# Patient Record
Sex: Female | Born: 1965 | Hispanic: Yes | Marital: Married | Smoking: Never smoker
Health system: Southern US, Community
[De-identification: ages and names within clinical notes are randomized; demographics above are authoritative.]

## PROBLEM LIST (undated history)

## (undated) DIAGNOSIS — E785 Hyperlipidemia, unspecified: Secondary | ICD-10-CM

## (undated) DIAGNOSIS — N200 Calculus of kidney: Secondary | ICD-10-CM

## (undated) HISTORY — DX: Hyperlipidemia, unspecified: E78.5

## (undated) HISTORY — DX: Calculus of kidney: N20.0

## (undated) HISTORY — PX: TOTAL ABDOMINAL HYSTERECTOMY W/ BILATERAL SALPINGOOPHORECTOMY: SHX83

## (undated) HISTORY — PX: CHOLECYSTECTOMY: SHX55

## (undated) HISTORY — PX: COLONOSCOPY: SHX174

---

## 2004-01-01 ENCOUNTER — Emergency Department (HOSPITAL_COMMUNITY): Admission: EM | Admit: 2004-01-01 | Discharge: 2004-01-01 | Payer: Self-pay | Admitting: Family Medicine

## 2005-05-03 ENCOUNTER — Encounter (INDEPENDENT_AMBULATORY_CARE_PROVIDER_SITE_OTHER): Payer: Self-pay | Admitting: Specialist

## 2005-05-03 ENCOUNTER — Observation Stay (HOSPITAL_COMMUNITY): Admission: RE | Admit: 2005-05-03 | Discharge: 2005-05-04 | Payer: Self-pay | Admitting: *Deleted

## 2005-10-14 ENCOUNTER — Emergency Department (HOSPITAL_COMMUNITY): Admission: EM | Admit: 2005-10-14 | Discharge: 2005-10-15 | Payer: Self-pay | Admitting: Emergency Medicine

## 2006-06-09 ENCOUNTER — Encounter: Admission: RE | Admit: 2006-06-09 | Discharge: 2006-06-09 | Payer: Self-pay | Admitting: *Deleted

## 2007-03-30 LAB — HM PAP SMEAR: HM Pap smear: 0

## 2008-02-29 ENCOUNTER — Encounter: Payer: Self-pay | Admitting: Gynecology

## 2008-02-29 ENCOUNTER — Other Ambulatory Visit: Admission: RE | Admit: 2008-02-29 | Discharge: 2008-02-29 | Payer: Self-pay | Admitting: Gynecology

## 2008-02-29 ENCOUNTER — Ambulatory Visit: Payer: Self-pay | Admitting: Gynecology

## 2009-07-27 LAB — HM MAMMOGRAPHY: HM Mammogram: 0

## 2009-08-21 ENCOUNTER — Encounter: Admission: RE | Admit: 2009-08-21 | Discharge: 2009-08-21 | Payer: Self-pay | Admitting: Family Medicine

## 2009-12-23 ENCOUNTER — Ambulatory Visit: Payer: Self-pay | Admitting: Gynecology

## 2009-12-23 ENCOUNTER — Encounter: Payer: Self-pay | Admitting: Emergency Medicine

## 2009-12-23 ENCOUNTER — Ambulatory Visit (HOSPITAL_COMMUNITY): Admission: AD | Admit: 2009-12-23 | Discharge: 2009-12-23 | Payer: Self-pay | Admitting: Gynecology

## 2009-12-29 ENCOUNTER — Ambulatory Visit: Payer: Self-pay | Admitting: Gynecology

## 2010-03-29 LAB — HM COLONOSCOPY: HM Colonoscopy: 0

## 2010-04-19 ENCOUNTER — Encounter: Payer: Self-pay | Admitting: *Deleted

## 2010-06-11 LAB — CBC
Hemoglobin: 14.8 g/dL (ref 12.0–15.0)
MCH: 33.3 pg (ref 26.0–34.0)
RBC: 4.44 MIL/uL (ref 3.87–5.11)
RDW: 12.7 % (ref 11.5–15.5)
WBC: 7.5 10*3/uL (ref 4.0–10.5)

## 2010-06-11 LAB — URINALYSIS, ROUTINE W REFLEX MICROSCOPIC
Glucose, UA: NEGATIVE mg/dL
Ketones, ur: 15 mg/dL — AB
Nitrite: NEGATIVE
Protein, ur: NEGATIVE mg/dL
Urobilinogen, UA: 0.2 mg/dL (ref 0.0–1.0)
pH: 6 (ref 5.0–8.0)

## 2010-06-11 LAB — LIPASE, BLOOD: Lipase: 28 U/L (ref 11–59)

## 2010-06-11 LAB — POCT PREGNANCY, URINE: Preg Test, Ur: NEGATIVE

## 2010-06-11 LAB — DIFFERENTIAL
Basophils Absolute: 0 10*3/uL (ref 0.0–0.1)
Eosinophils Absolute: 0 10*3/uL (ref 0.0–0.7)
Lymphs Abs: 0.8 10*3/uL (ref 0.7–4.0)
Monocytes Relative: 2 % — ABNORMAL LOW (ref 3–12)
Neutro Abs: 6.5 10*3/uL (ref 1.7–7.7)

## 2010-06-11 LAB — COMPREHENSIVE METABOLIC PANEL
AST: 39 U/L — ABNORMAL HIGH (ref 0–37)
Alkaline Phosphatase: 77 U/L (ref 39–117)
BUN: 8 mg/dL (ref 6–23)
Calcium: 9.3 mg/dL (ref 8.4–10.5)
Glucose, Bld: 136 mg/dL — ABNORMAL HIGH (ref 70–99)
Total Bilirubin: 0.9 mg/dL (ref 0.3–1.2)

## 2010-08-03 ENCOUNTER — Other Ambulatory Visit: Payer: Self-pay | Admitting: Family Medicine

## 2010-08-03 DIAGNOSIS — Z1231 Encounter for screening mammogram for malignant neoplasm of breast: Secondary | ICD-10-CM

## 2010-08-14 NOTE — Op Note (Signed)
Tara Bruce, LASTER NO.:  [id number]   MEDICAL RECORD NO.:  [id number]          PATIENT TYPE:  OBV   LOCATION:  9399                          FACILITY:  [facility]   PHYSICIAN:  Caspar B. Earlene Plater, M.D.  DATE OF BIRTH:  03-22-1966   DATE OF PROCEDURE:  05/03/2005  DATE OF DISCHARGE:                                 OPERATIVE REPORT   PREOPERATIVE DIAGNOSES:  1.  Menorrhagia.  2.  Uterine fibroids.   POSTOPERATIVE DIAGNOSES:  1.  Menorrhagia.  2.  Uterine fibroids.   OPERATION/PROCEDURE:  Laparoscopic-assisted vaginal hysterectomy.   SURGEON:  Chester Holstein. Earlene Plater, M.D.   ASSISTANT:  Genia Del, M.D.   ANESTHESIA:  General.   SPECIMENS:  195-gram uterus and cervix.  Normal tubes and ovaries.   ESTIMATED BLOOD LOSS:  100 mg.   COMPLICATIONS:  None.   INDICATIONS:  The patient with a history of heavy and irregular menstrual  bleeding, not responding to medical management.  Previous hysteroscopy  dilatation and curettage showed only a hyperplastic polyp and benign  endometrium. No features of atypia.  The patient desires definitive surgical  management.  Advised of the risks of surgery including infection, bleeding,  damage to surrounding organs.   DESCRIPTION OF PROCEDURE:  The patient was taken to the operating room and  general anesthesia obtained.  The patient was placed in the [facility] stirrups  and prepped and draped in the standard fashion and Foley catheter inserted  into the bladder.  Indigo carmine soaked sponge stick was placed in the  vagina in the anterior portio.   A 10 mm incision was placed in the umbilicus, carried sharply to the fascia.  The fascia was divided sharply and elevated.  The posterior sheath and  peritoneum elevated and entered sharply.  Pursestring suture of 0 Vicryl  placed in the around the fascial defect.  Hasson cannula inserted and  secured.  Pneumoperitoneum obtained with CO2 gas.   Trendelenburg position was obtained.   Pelvis and abdomen inspected with the  above findings noted.  In addition, the appendix and upper abdomen appeared  normal.   A 5 mm Xcel trocar was placed in each lower quadrant under direct  laparoscopic visualization.   The course of each ureter was identified.  Left cornua grasped with the  tooth tenaculum.  Round ligament, tube and utero-ovarian ligament were  sequentially sealed and divided with the Harmonic scalpel.  Broad ligament  was skeletonized with the Harmonic.  Bladder flap created sharply with the  Harmonic.  Left uterine artery sealed and divided with the Harmonic.  Entire  procedure was repeated on the right side in the same manner.  Bladder flap  was created sharply with the Harmonic.  Bladder mobilized inferiorly.  Anterior colpotomy made with the Harmonic, repeated posteriorly in similar  manner.  Pneumoperitoneum was released.  Pedicles were hemostatic.   Attention turned to the vagina.  Weighted speculum was inserted and the  colpotomies extended sharply and with Bovie cautery.  The uterosacral  ligaments were clamped with curved Heaneys, divided and suture ligated with  0 Vicryl.  The remainder of the cardinal ligaments were then clamped with  the LigaSure and cauterized and divided sharply.  Uterus and cervix were  removed as an intact specimen.  One bleeder noted on the left vaginal cuff  in the midline which was made hemostatic with a figure-of-eight stitch of 0  Vicryl.  A culdoplasty suture placed posteriorly from posterior vagina to  the left uterosacral ligament reefing along the posterior peritoneum, taking  a bite of the right uterosacral ligament and exiting posteriorly.  The  remainder of the vaginal cuff was closed with interrupted figure-of-eight  sutures of 0 Vicryl and hemostasis was obtained.  Culdoplasty suture was  then tied down with good support of the cuff and reapproximation of the  uterosacral ligaments.   Attention turned to the abdomen.   Pneumoperitoneum was reobtained.  Pelvis  irrigated, inspected and was hemostatic.  Therefore, the procedure was  terminated.  Inferior ports removed.  Their sites were hemostatic.  Scope  removed.  Gas released. Hasson cannula removed.  Abdominal wall elevated  with [employer] retractors and the pursestring sutures snugged down.  This  obliterated the fascial defect and no intra-abdominal contents herniated  through the prior closure.   Skin was closed in the umbilicus with 4-0 Vicryl.  Skin was closed in the  inferior ports with Dermabond.  The patient tolerated the procedure well.  There were no complications.  She was taken to the recovery room awake,  alert and in stable condition.  All counts were correct per the operating  room staff.      Gerri Spore B. Earlene Plater, M.D.  Electronically Signed     WBD/MEDQ  D:  05/03/2005  T:  05/03/2005  Job:  [id number]

## 2010-08-27 ENCOUNTER — Ambulatory Visit: Payer: Self-pay

## 2010-08-28 ENCOUNTER — Ambulatory Visit
Admission: RE | Admit: 2010-08-28 | Discharge: 2010-08-28 | Disposition: A | Discharge status: Home / Self Care | Payer: Medicaid Other | Source: Ambulatory Visit | Attending: Family Medicine | Admitting: Family Medicine

## 2010-08-28 DIAGNOSIS — Z1231 Encounter for screening mammogram for malignant neoplasm of breast: Secondary | ICD-10-CM

## 2010-09-28 ENCOUNTER — Encounter: Payer: Self-pay | Admitting: Family Medicine

## 2011-03-30 HISTORY — PX: LAPAROSCOPIC OOPHERECTOMY: SHX6507

## 2011-08-13 ENCOUNTER — Other Ambulatory Visit: Payer: Self-pay | Admitting: Family Medicine

## 2011-08-13 DIAGNOSIS — Z1231 Encounter for screening mammogram for malignant neoplasm of breast: Secondary | ICD-10-CM

## 2011-09-02 ENCOUNTER — Ambulatory Visit: Payer: Medicaid Other

## 2012-03-16 ENCOUNTER — Emergency Department (HOSPITAL_COMMUNITY): Payer: BC Managed Care – PPO

## 2012-03-16 ENCOUNTER — Encounter (HOSPITAL_COMMUNITY): Payer: Self-pay

## 2012-03-16 ENCOUNTER — Other Ambulatory Visit: Payer: Self-pay

## 2012-03-16 ENCOUNTER — Emergency Department (HOSPITAL_COMMUNITY)
Admission: EM | Admit: 2012-03-16 | Discharge: 2012-03-16 | Disposition: A | Discharge status: Home / Self Care | Payer: BC Managed Care – PPO | Attending: Emergency Medicine | Admitting: Emergency Medicine

## 2012-03-16 DIAGNOSIS — N201 Calculus of ureter: Secondary | ICD-10-CM

## 2012-03-16 DIAGNOSIS — Z8742 Personal history of other diseases of the female genital tract: Secondary | ICD-10-CM | POA: Insufficient documentation

## 2012-03-16 DIAGNOSIS — R1110 Vomiting, unspecified: Secondary | ICD-10-CM | POA: Insufficient documentation

## 2012-03-16 DIAGNOSIS — Z90710 Acquired absence of both cervix and uterus: Secondary | ICD-10-CM | POA: Insufficient documentation

## 2012-03-16 LAB — COMPREHENSIVE METABOLIC PANEL
ALT: 32 U/L (ref 0–35)
AST: 25 U/L (ref 0–37)
Albumin: 4.1 g/dL (ref 3.5–5.2)
Alkaline Phosphatase: 102 U/L (ref 39–117)
BUN: 12 mg/dL (ref 6–23)
CO2: 22 mEq/L (ref 19–32)
Calcium: 9.4 mg/dL (ref 8.4–10.5)
Chloride: 99 mEq/L (ref 96–112)
Creatinine, Ser: 0.75 mg/dL (ref 0.50–1.10)
GFR calc Af Amer: >90 mL/min (ref >90)
GFR calc non Af Amer: >90 mL/min (ref >90)
Glucose, Bld: 121 mg/dL — ABNORMAL HIGH (ref 70–99)
Potassium: 3.3 mEq/L — ABNORMAL LOW (ref 3.5–5.1)
Sodium: 136 mEq/L (ref 135–145)
Total Bilirubin: 0.5 mg/dL (ref 0.3–1.2)
Total Protein: 7.5 g/dL (ref 6.0–8.3)

## 2012-03-16 LAB — CBC WITH DIFFERENTIAL/PLATELET
Basophils Absolute: 0 10*3/uL (ref 0.0–0.1)
Basophils Relative: 0 % (ref 0–1)
Eosinophils Absolute: 0 10*3/uL (ref 0.0–0.7)
Eosinophils Relative: 0 % (ref 0–5)
HCT: 40.3 % (ref 36.0–46.0)
Hemoglobin: 14.9 g/dL (ref 12.0–15.0)
Lymphocytes Relative: 10 % — ABNORMAL LOW (ref 12–46)
Lymphs Abs: 0.8 10*3/uL (ref 0.7–4.0)
MCH: 33.1 pg (ref 26.0–34.0)
MCHC: 37 g/dL — ABNORMAL HIGH (ref 30.0–36.0)
MCV: 89.6 fL (ref 78.0–100.0)
Monocytes Absolute: 0.5 10*3/uL (ref 0.1–1.0)
Monocytes Relative: 6 % (ref 3–12)
Neutro Abs: 6.9 10*3/uL (ref 1.7–7.7)
Neutrophils Relative %: 84 % — ABNORMAL HIGH (ref 43–77)
Platelets: 300 10*3/uL (ref 150–400)
RBC: 4.5 MIL/uL (ref 3.87–5.11)
RDW: 12.8 % (ref 11.5–15.5)
WBC: 8.3 10*3/uL (ref 4.0–10.5)

## 2012-03-16 LAB — URINE MICROSCOPIC-ADD ON

## 2012-03-16 LAB — URINALYSIS, ROUTINE W REFLEX MICROSCOPIC
Bilirubin Urine: NEGATIVE
Glucose, UA: NEGATIVE mg/dL
Ketones, ur: 15 mg/dL — AB
Leukocytes, UA: NEGATIVE
Nitrite: NEGATIVE
Protein, ur: NEGATIVE mg/dL
Specific Gravity, Urine: 1.025 (ref 1.005–1.030)
Urobilinogen, UA: 0.2 mg/dL (ref 0.0–1.0)
pH: 6 (ref 5.0–8.0)

## 2012-03-16 LAB — LIPASE, BLOOD: Lipase: 29 U/L (ref 11–59)

## 2012-03-16 MED ORDER — OXYCODONE-ACETAMINOPHEN 5-325 MG PO TABS: 1.0000 | ORAL_TABLET | Freq: Four times a day (QID) | ORAL | Status: DC | PRN

## 2012-03-16 MED ORDER — SODIUM CHLORIDE 0.9 % IV BOLUS (SEPSIS)
1000.0000 mL | Freq: Once | INTRAVENOUS | Status: AC
  Administered 2012-03-16: 1000 mL via INTRAVENOUS

## 2012-03-16 MED ORDER — PROMETHAZINE HCL 25 MG PO TABS: 25.0000 mg | ORAL_TABLET | Freq: Four times a day (QID) | ORAL | Status: DC | PRN

## 2012-03-16 MED ORDER — KETOROLAC TROMETHAMINE 30 MG/ML IJ SOLN
30.0000 mg | Freq: Once | INTRAMUSCULAR | Status: AC
  Administered 2012-03-16: 30 mg via INTRAVENOUS
  Filled 2012-03-16: qty 1

## 2012-03-16 MED ORDER — HYDROMORPHONE HCL PF 1 MG/ML IJ SOLN
1.0000 mg | Freq: Once | INTRAMUSCULAR | Status: AC
  Administered 2012-03-16: 1 mg via INTRAVENOUS
  Filled 2012-03-16: qty 1

## 2012-03-16 MED ORDER — ONDANSETRON HCL 4 MG/2ML IJ SOLN
4.0000 mg | Freq: Once | INTRAMUSCULAR | Status: AC
  Administered 2012-03-16: 4 mg via INTRAVENOUS
  Filled 2012-03-16: qty 2

## 2012-03-16 NOTE — ED Notes (Addendum)
Pt presents with 2 day h/o L sided abdominal pain.  Pt reports nausea and vomiting, denies any diarrhea.  Pt denies any vaginal bleeding or discharge.

## 2012-03-16 NOTE — ED Provider Notes (Signed)
History     CSN: [id number]  Arrival date & time 03/16/12  1447   First MD Initiated Contact with Patient 03/16/12 1712      Chief Complaint  Patient presents with  . Abdominal Pain    (Consider location/radiation/quality/duration/timing/severity/associated sxs/prior treatment) HPI Patient presents emergency department with left flank pain, and intermittent for the last 2 days , but became constant today around 11:30.  Patient, states that she had several episodes of vomiting.  Patient did not take anything prior to arrival, for her symptoms.  Patient denies chest pain, shortness breath, back, pain, diarrhea, fever, or dizziness.  Patient, states she did have some mild discomfort with urination.  Patient, states nothing seemed to make her condition better or worse.  Patient has a history of kidney stone, but has had right-sided ovarian torsion.  History reviewed. No pertinent past medical history.  Past Surgical History  Procedure Date  . Cholecystectomy   . Total abdominal hysterectomy w/ bilateral salpingoophorectomy     No family history on file.  History  Substance Use Topics  . Smoking status: Never Smoker   . Smokeless tobacco: Not on file  . Alcohol Use: No    OB History    Grav Para Term Preterm Abortions TAB SAB Ect Mult Living                  Review of Systems All other systems negative except as documented in the HPI. All pertinent positives and negatives as reviewed in the HPI.  Allergies  Review of patient's allergies indicates no known allergies.  Home Medications  No current outpatient prescriptions on file.  BP 114/67  Pulse 82  Temp 98.3 F (36.8 C) (Oral)  Resp 26  SpO2 98%  Physical Exam  Nursing note and vitals reviewed. Constitutional: She is oriented to person, place, and time. She appears well-developed and well-nourished. No distress.  HENT:  Head: Normocephalic and atraumatic.  Mouth/Throat: Oropharynx is clear and moist.  Neck:  Normal range of motion. Neck supple.  Cardiovascular: Normal rate, regular rhythm and normal heart sounds.  Exam reveals no gallop and no friction rub.   No murmur heard. Pulmonary/Chest: Effort normal and breath sounds normal.  Abdominal: Soft. Bowel sounds are normal. She exhibits no distension. There is no tenderness. There is no rebound and no guarding.  Neurological: She is alert and oriented to person, place, and time.  Skin: Skin is warm and dry.    ED Course  Procedures (including critical care time)  Labs Reviewed  COMPREHENSIVE METABOLIC PANEL - Abnormal; Notable for the following:    Potassium 3.3 (*)     Glucose, Bld 121 (*)     All other components within normal limits  CBC WITH DIFFERENTIAL - Abnormal; Notable for the following:    MCHC 37.0 (*)     Neutrophils Relative 84 (*)     Lymphocytes Relative 10 (*)     All other components within normal limits  URINALYSIS, ROUTINE W REFLEX MICROSCOPIC - Abnormal; Notable for the following:    Hgb urine dipstick SMALL (*)     Ketones, ur 15 (*)     All other components within normal limits  URINE MICROSCOPIC-ADD ON - Abnormal; Notable for the following:    Squamous Epithelial / LPF FEW (*)     Bacteria, UA FEW (*)     All other components within normal limits  LIPASE, BLOOD   Ct Abdomen Pelvis Wo Contrast  03/16/2012  *RADIOLOGY  REPORT*  Clinical Data: Left flank pain.  CT ABDOMEN AND PELVIS WITHOUT CONTRAST  Technique:  Multidetector CT imaging of the abdomen and pelvis was performed following the standard protocol without intravenous contrast.  Comparison: CT abdomen pelvis 12/22/2009  Findings: Dependent atelectasis at the lung bases.  No consolidation or pleural effusion.  There is a 3 mm stone at the level of the left ureterovesical junction.  This results in moderate left hydroureteronephrosis and perinephric stranding.  The left kidney appears edematous.  There are multiple bilateral intrarenal calculi.  Approximately  10 small stones are seen in the left kidney, the largest of which measures 2 mm.  There is no hydronephrosis on the right.  There are approximately 6 stones in the right kidney.  The largest stone in the right kidney is 4 mm.  Right ureter is normal in caliber.  The urinary bladder demonstrates normal wall thickness.  There is fatty infiltration of the liver.  No focal hepatic mass or biliary ductal dilatation is identified on noncontrast imaging. The patient is status post cholecystectomy.  The noncontrasted appearance of the spleen, pancreas, and adrenal glands is within normal limits.  The stomach is not very distended at the time imaging, and unremarkable.  Small bowel loops are normal in caliber.  Probable appendix on image number 58, appears normal.  Patient is status post hysterectomy.  No adnexal mass.  Abdominal aorta is normal in caliber.  There is no ascites or lymphadenopathy.  Inguinal regions are unremarkable.  Vertebral bodies are normal in height and alignment.  No acute bony abnormality.  IMPRESSION: 1. 3 mm left ureterovesical junction stone results in moderate left hydroureteronephrosis. 2.  Multiple bilateral intrarenal calculi. 3.  Fatty infiltration the liver. 4.  Cholecystectomy and hysterectomy.   Original Report Authenticated By: Britta Mccreedy, M.D.     Patient received significant pain relief from 1 mg of Dilaudid IV.  She was given by results of her CT scan, which showed a 3 mm left UVJ stone.  Patient, is not having any vomiting, here in the emergency department.  Patient, is advised return here for any worsening in her, symptoms.  She'll also be given urology followup.    MDM  MDM Reviewed: nursing note and vitals Interpretation: labs and CT scan            Carlyle Dolly, PA-C 03/16/12 2108

## 2012-03-16 NOTE — ED Provider Notes (Signed)
History/physical exam/procedure(s) were performed by non-physician practitioner and as supervising physician I was immediately available for consultation/collaboration. I have reviewed all notes and am in agreement with care and plan.   Hilario Quarry, MD 03/16/12 [phone]

## 2012-10-26 ENCOUNTER — Ambulatory Visit (INDEPENDENT_AMBULATORY_CARE_PROVIDER_SITE_OTHER): Payer: 59 | Admitting: Physician Assistant

## 2012-10-26 ENCOUNTER — Encounter: Payer: Self-pay | Admitting: Physician Assistant

## 2012-10-26 VITALS — BP 106/74 | HR 60 | Temp 98.3°F | Resp 18 | Ht 59.75 in | Wt 140.0 lb

## 2012-10-26 DIAGNOSIS — G8929 Other chronic pain: Secondary | ICD-10-CM

## 2012-10-26 DIAGNOSIS — G2581 Restless legs syndrome: Secondary | ICD-10-CM

## 2012-10-26 DIAGNOSIS — H9209 Otalgia, unspecified ear: Secondary | ICD-10-CM

## 2012-10-26 LAB — ANEMIA PANEL
ABS Retic: 112 10*3/uL (ref 19.0–186.0)
Iron: 139 ug/dL (ref 42–145)
RBC.: 4.48 MIL/uL (ref 3.87–5.11)
Retic Ct Pct: 2.5 % — ABNORMAL HIGH (ref 0.4–2.3)
TIBC: 358 ug/dL (ref 250–470)
UIBC: 219 ug/dL (ref 125–400)

## 2012-10-26 MED ORDER — ROPINIROLE HCL 0.25 MG PO TABS: ORAL_TABLET | ORAL | Status: DC

## 2012-10-26 NOTE — Progress Notes (Signed)
Patient ID: Tara Bruce MRN: [id number], DOB: 1966-01-08, 47 y.o. Date of Encounter: @DATE @  Chief Complaint:  Chief Complaint  Patient presents with  . c/o pain in lower legs mostly at night from knees going down  . throbbing in right ear    HPI: 47 y.o. year old hispanic female  presents with her daughter, who is also patient of mine, and who translates for her mom.   She has c/o tingly sensation in both lower legs at night. Only occurs at night when lying in bed or on couch prior to going to be. Has no pain,numbness, tingling, or any abnormal sensations in legs at all during the day whne she is up moving around. At night when she is still she feels this tingly/creepuy-crawly sensation in lower legs -both legs equally. It does not matter what postion she is in-feels it even if legs stretch out straight.  Sensation resoves if she moves her legs.   Also c/o a "discomfort" in Right Ear. "Feels like somebody breathing on ear"  "Saw me about this before." Says this comes and goes for years. This episode has been present for 2 weeks. Says it will bother her for a whil, then will resolve for a while. Will resolve wihtout any treatment. In past, 05/14/2011, I rec using Decong. Pt did this tx for that episode but not currently on decong for this epsidoe now.  Has no pain in ear, just this weird senstion as above. And, has no problem with left ear.    History reviewed. No pertinent past medical history.   Home Meds: See attached medication section for current medication list. Any medications entered into computer today will not appear on this note's list. The medications listed below were entered prior to today. No current outpatient prescriptions on file prior to visit.   No current facility-administered medications on file prior to visit.    Allergies: No Known Allergies  History   Social History  . Marital Status: Married    Spouse Name: N/A    Number of Children: N/A  . Years of  Education: N/A   Occupational History  . Not on file.   Social History Main Topics  . Smoking status: Never Smoker   . Smokeless tobacco: Not on file  . Alcohol Use: No  . Drug Use: No  . Sexually Active:    Other Topics Concern  . Not on file   Social History Narrative  . No narrative on file    History reviewed. No pertinent family history.   Review of Systems:  See HPI for pertinent ROS. All other ROS negative.    Physical Exam: Blood pressure 106/74, pulse 60, temperature 98.3 F (36.8 C), temperature source Oral, resp. rate 18, height 4' 11.75" (1.518 m), weight 140 lb (63.504 kg)., Body mass index is 27.56 kg/(m^2). General:WNWD Hispanic Female. Pleasant.  Appears in no acute distress. Head: Normocephalic, atraumatic, eyes without discharge, sclera non-icteric, nares are without discharge. Bilateral auditory canals clear, TM's are without perforation, pearly grey and translucent with reflective cone of light bilaterally. Oral cavity moist, posterior pharynx without exudate, erythema, peritonsillar abscess, or post nasal drip.  Neck: Supple. No thyromegaly. No lymphadenopathy. Lungs: Clear bilaterally to auscultation without wheezes, rales, or rhonchi. Breathing is unlabored. Heart: RRR with S1 S2. No murmurs, rubs, or gallops. Musculoskeletal:  Strength and tone normal for age. Extremities/Skin: Warm and dry. No clubbing or cyanosis. No edema. No rashes or suspicious lesions. Peripheral Pulses Intact.  Neuro:  Alert and oriented X 3. Moves all extremities spontaneously. Gait is normal. CNII-XII grossly in tact. Psych:  Responds to questions appropriately with a normal affect.     ASSESSMENT AND PLAN:  47 y.o. year old female with  1. Restless leg syndrome Check Irno to R/O Iron Deficiency as underlyin g cause of RLS. Check B12 to R/O B12 Deficiency. If these are normal, take Requip as below. Call in 2 weeks for furhter dosing instructions.  - Anemia panel - rOPINIRole  (REQUIP) 0.25 MG tablet; Take ONE 1-3 hours before sleep for 2 days then increase to TWO 1-3 hours before sleep for 1 week. Then call for further instructions. [phone].  Dispense: 60 tablet; Refill: 0  2. Chronic ear pain, right Rec otc Decongestants. Will refer to ENT for eval. Has never seen a ENT per pt.  - Ambulatory referral to ENT  ROV for f/u in 3 mos. Call in interrim for dosing instructions as above.  Signed, [address] [place], [place], [facility] 10/26/2012 10:09 AM

## 2013-08-27 ENCOUNTER — Other Ambulatory Visit: Payer: Self-pay | Admitting: Physician Assistant

## 2013-08-27 DIAGNOSIS — Z1231 Encounter for screening mammogram for malignant neoplasm of breast: Secondary | ICD-10-CM

## 2013-09-06 ENCOUNTER — Ambulatory Visit: Payer: BC Managed Care – PPO

## 2013-09-13 ENCOUNTER — Other Ambulatory Visit: Payer: Self-pay

## 2013-09-13 ENCOUNTER — Ambulatory Visit
Admission: RE | Admit: 2013-09-13 | Discharge: 2013-09-13 | Disposition: A | Discharge status: Home / Self Care | Payer: 59 | Source: Ambulatory Visit | Attending: Physician Assistant | Admitting: Physician Assistant

## 2013-09-13 ENCOUNTER — Ambulatory Visit
Admission: RE | Admit: 2013-09-13 | Discharge: 2013-09-13 | Disposition: A | Discharge status: Home / Self Care | Payer: 59 | Source: Ambulatory Visit

## 2013-09-13 DIAGNOSIS — Z1231 Encounter for screening mammogram for malignant neoplasm of breast: Secondary | ICD-10-CM

## 2013-10-11 ENCOUNTER — Encounter: Payer: 59 | Admitting: Physician Assistant

## 2014-09-20 ENCOUNTER — Other Ambulatory Visit: Payer: Self-pay

## 2014-09-20 DIAGNOSIS — Z1231 Encounter for screening mammogram for malignant neoplasm of breast: Secondary | ICD-10-CM

## 2014-10-18 ENCOUNTER — Ambulatory Visit
Admission: RE | Admit: 2014-10-18 | Discharge: 2014-10-18 | Disposition: A | Discharge status: Home / Self Care | Payer: PRIVATE HEALTH INSURANCE | Source: Ambulatory Visit

## 2014-10-18 DIAGNOSIS — Z1231 Encounter for screening mammogram for malignant neoplasm of breast: Secondary | ICD-10-CM

## 2014-10-31 ENCOUNTER — Encounter: Payer: Self-pay | Admitting: Physician Assistant

## 2014-10-31 ENCOUNTER — Ambulatory Visit (INDEPENDENT_AMBULATORY_CARE_PROVIDER_SITE_OTHER): Payer: 59 | Admitting: Physician Assistant

## 2014-10-31 VITALS — BP 130/72 | HR 68 | Temp 98.4°F | Resp 14 | Ht 60.0 in | Wt 144.0 lb

## 2014-10-31 DIAGNOSIS — Z23 Encounter for immunization: Secondary | ICD-10-CM

## 2014-10-31 DIAGNOSIS — Z0000 Encounter for general adult medical examination without abnormal findings: Secondary | ICD-10-CM

## 2014-10-31 NOTE — Progress Notes (Signed)
Patient ID: Takyia Sindt MRN: [id number], DOB: 09-16-1965, 49 y.o. Date of Encounter: 10/31/2014,   Chief Complaint: Physical (CPE)  49 y.o. y/o female from [place] is here for CPE.   She has no specific complaints or concerns to address today. Says that she is just here to get physical/checkup.   Review of Systems: Consitutional: No fever, chills, fatigue, night sweats, lymphadenopathy. No significant/unexplained weight changes. Eyes: No visual changes, eye redness, or discharge. ENT/Mouth: No ear pain, sore throat, nasal drainage, or sinus pain. Cardiovascular: No chest pressure,heaviness, tightness or squeezing, even with exertion. No increased shortness of breath or dyspnea on exertion.No palpitations, edema, orthopnea, PND. Respiratory: No cough, hemoptysis, SOB, or wheezing. Gastrointestinal: No anorexia, dysphagia, reflux, pain, nausea, vomiting, hematemesis, diarrhea, constipation, BRBPR, or melena. Breast: No mass, nodules, bulging, or retraction. No skin changes or inflammation. No nipple discharge. No lymphadenopathy. Genitourinary: No dysuria, hematuria, incontinence, vaginal discharge, pruritis, burning, abnormal bleeding, or pain. Musculoskeletal: No decreased ROM, No joint pain or swelling. No significant pain in neck, back, or extremities. Skin: No rash, pruritis, or concerning lesions. Neurological: No headache, dizziness, syncope, seizures, tremors, memory loss, coordination problems, or paresthesias. Psychological: No anxiety, depression, hallucinations, SI/HI. Endocrine: No polydipsia, polyphagia, polyuria, or known diabetes.No increased fatigue. No palpitations/rapid heart rate. No significant/unexplained weight change. All other systems were reviewed and are otherwise negative.  History reviewed. No pertinent past medical history.   Past Surgical History  Procedure Laterality Date  . Cholecystectomy    . Total abdominal hysterectomy w/ bilateral  salpingoophorectomy      Home Meds:  None  Allergies: No Known Allergies  History   Social History  . Marital Status: Married    Spouse Name: N/A  . Number of Children: N/A  . Years of Education: N/A   Occupational History  . Not on file.   Social History Main Topics  . Smoking status: Never Smoker   . Smokeless tobacco: Never Used  . Alcohol Use: No  . Drug Use: No  . Sexual Activity: Yes   Other Topics Concern  . Not on file   Social History Narrative   Never smoked.  Family History  Problem Relation Age of Onset  . Cancer Mother 49    Colon Cancer  . Cancer Father     Prostate Cancer  . Diabetes Sister   . Diabetes Sister   . Diabetes Sister     Physical Exam: Blood pressure 130/72, pulse 68, temperature 98.4 F (36.9 C), temperature source Oral, resp. rate 14, height 5' (1.524 m), weight 144 lb (65.318 kg)., Body mass index is 28.12 kg/(m^2). General: Well developed, well nourished Female from [place]. Appears in no acute distress. HEENT: Normocephalic, atraumatic. Conjunctiva pink, sclera non-icteric. Pupils 2 mm constricting to 1 mm, round, regular, and equally reactive to light and accomodation. EOMI. Internal auditory canal clear. TMs with good cone of light and without pathology. Nasal mucosa pink. Nares are without discharge. No sinus tenderness. Oral mucosa pink.  Pharynx without exudate.   Neck: Supple. Trachea midline. No thyromegaly. Full ROM. No lymphadenopathy.No Carotid Bruits. Lungs: Clear to auscultation bilaterally without wheezes, rales, or rhonchi. Breathing is of normal effort and unlabored. Cardiovascular: RRR with S1 S2. No murmurs, rubs, or gallops. Distal pulses 2+ symmetrically. No carotid or abdominal bruits. Breast: Symmetrical. No masses. Nipples without discharge. Abdomen: Soft, non-tender, non-distended with normoactive bowel sounds. No hepatosplenomegaly or masses. No rebound/guarding. No CVA tenderness. No hernias.    Genitourinary:  External  genitalia without lesions. Vaginal mucosa pink.No discharge present. History of hysterectomy and bilateral oophorectomy. Bimanual exam consistent with this. No masses. Musculoskeletal: Full range of motion and 5/5 strength throughout.  Skin: Warm and moist without erythema, ecchymosis, wounds, or rash. Neuro: A+Ox3. CN II-XII grossly intact. Moves all extremities spontaneously. Full sensation throughout. Normal gait. DTR 2+ throughout upper and lower extremities. Psych:  Responds to questions appropriately with a normal affect.   Assessment/Plan:  49 y.o. y/o female here for CPE  1. Visit for preventive health examination  A. Screening Labs: She is not fasting but states that she can return fasting tomorrow morning for lab work. - CBC with Differential/Platelet; Future - COMPLETE METABOLIC PANEL WITH GFR; Future - Lipid panel; Future - TSH; Future  B. Pap: She has history of hysterectomy which was NOT performed secondary to cancer. Therefore no further Pap smear indicated.  C. Screening Mammogram: She had mammogram 10/21/2014 which was negative.  D. DEXA/BMD:  At next visit will discuss her age when she had hysterectomy and discuss whether she has been on hormone replacement at any point to determine at what age we should start bone density scans.  E. Colorectal Cancer Screening: No indication to need this until age 32. Her mother did die of colon cancer but says that this was not diagnosed until age 98.  F. Immunizations:  Influenza:-----N/A---August Tetanus:--- She states that it has been > 10 years since her last tetanus shot. She is agreeable to update this today. Pneumococcal: She does not smoke. She has no indication to require a pneumonia vaccine until age 60. Zostavax:  Will discuss at age 25   - Tdap vaccine greater than or equal to 7yo IM  2. Need for prophylactic vaccination with combined diphtheria-tetanus-pertussis (DTP) vaccine - Tdap  vaccine greater than or equal to 7yo IM    Signed, [address] [place], [place], [facility] 10/31/2014 5:23 PM

## 2014-11-01 ENCOUNTER — Other Ambulatory Visit: Payer: 59

## 2014-11-01 DIAGNOSIS — Z0000 Encounter for general adult medical examination without abnormal findings: Secondary | ICD-10-CM

## 2014-11-01 LAB — CBC WITH DIFFERENTIAL/PLATELET
Basophils Absolute: 0 10*3/uL (ref 0.0–0.1)
Basophils Relative: 1 % (ref 0–1)
Eosinophils Absolute: 0.1 10*3/uL (ref 0.0–0.7)
Eosinophils Relative: 2 % (ref 0–5)
HCT: 42.2 % (ref 36.0–46.0)
Hemoglobin: 14.6 g/dL (ref 12.0–15.0)
Lymphocytes Relative: 40 % (ref 12–46)
Lymphs Abs: 1.9 10*3/uL (ref 0.7–4.0)
MCH: 32.7 pg (ref 26.0–34.0)
MCHC: 34.6 g/dL (ref 30.0–36.0)
MCV: 94.6 fL (ref 78.0–100.0)
MONO ABS: 0.4 10*3/uL (ref 0.1–1.0)
MPV: 9.3 fL (ref 8.6–12.4)
Monocytes Relative: 8 % (ref 3–12)
NEUTROS ABS: 2.4 10*3/uL (ref 1.7–7.7)
NEUTROS PCT: 49 % (ref 43–77)
PLATELETS: 286 10*3/uL (ref 150–400)
RBC: 4.46 MIL/uL (ref 3.87–5.11)
RDW: 14.2 % (ref 11.5–15.5)
WBC: 4.8 10*3/uL (ref 4.0–10.5)

## 2014-11-01 LAB — COMPLETE METABOLIC PANEL WITH GFR
ALT: 57 U/L — ABNORMAL HIGH (ref 6–29)
AST: 38 U/L — ABNORMAL HIGH (ref 10–35)
Albumin: 4.1 g/dL (ref 3.6–5.1)
Alkaline Phosphatase: 90 U/L (ref 33–115)
BUN: 10 mg/dL (ref 7–25)
CALCIUM: 9.5 mg/dL (ref 8.6–10.2)
CHLORIDE: 104 mmol/L (ref 98–110)
CO2: 26 mmol/L (ref 20–31)
CREATININE: 0.67 mg/dL (ref 0.50–1.10)
GFR, Est African American: >89 mL/min (ref >=60)
Glucose, Bld: 93 mg/dL (ref 70–99)
Potassium: 4.4 mmol/L (ref 3.5–5.3)
Sodium: 141 mmol/L (ref 135–146)
Total Bilirubin: 0.5 mg/dL (ref 0.2–1.2)
Total Protein: 6.7 g/dL (ref 6.1–8.1)

## 2014-11-01 LAB — LIPID PANEL
CHOLESTEROL: 196 mg/dL (ref 125–200)
HDL: 50 mg/dL (ref >=46)
LDL Cholesterol: 118 mg/dL (ref <130)
TRIGLYCERIDES: 140 mg/dL (ref <150)
Total CHOL/HDL Ratio: 3.9 Ratio (ref <=5.0)
VLDL: 28 mg/dL (ref <30)

## 2014-11-01 LAB — TSH: TSH: 2.69 u[IU]/mL (ref 0.350–4.500)

## 2015-05-11 ENCOUNTER — Emergency Department: Payer: BLUE CROSS/BLUE SHIELD

## 2015-05-11 ENCOUNTER — Emergency Department
Admission: EM | Admit: 2015-05-11 | Discharge: 2015-05-11 | Disposition: A | Discharge status: Home / Self Care | Payer: BLUE CROSS/BLUE SHIELD | Attending: Emergency Medicine | Admitting: Emergency Medicine

## 2015-05-11 DIAGNOSIS — Z3202 Encounter for pregnancy test, result negative: Secondary | ICD-10-CM | POA: Insufficient documentation

## 2015-05-11 DIAGNOSIS — N201 Calculus of ureter: Secondary | ICD-10-CM | POA: Diagnosis not present

## 2015-05-11 DIAGNOSIS — R109 Unspecified abdominal pain: Secondary | ICD-10-CM | POA: Diagnosis present

## 2015-05-11 LAB — URINALYSIS COMPLETE WITH MICROSCOPIC (ARMC ONLY)
Bilirubin Urine: NEGATIVE
Glucose, UA: NEGATIVE mg/dL
Ketones, ur: NEGATIVE mg/dL
Leukocytes, UA: NEGATIVE
NITRITE: NEGATIVE
PROTEIN: NEGATIVE mg/dL
Specific Gravity, Urine: 1.01 (ref 1.005–1.030)
WBC UA: NONE SEEN WBC/hpf (ref 0–5)
pH: 8 (ref 5.0–8.0)

## 2015-05-11 LAB — BASIC METABOLIC PANEL
Anion gap: 7 (ref 5–15)
BUN: 13 mg/dL (ref 6–20)
CO2: 27 mmol/L (ref 22–32)
CREATININE: 0.9 mg/dL (ref 0.44–1.00)
Calcium: 9.1 mg/dL (ref 8.9–10.3)
Chloride: 104 mmol/L (ref 101–111)
GFR calc Af Amer: >60 mL/min (ref >60)
GLUCOSE: 105 mg/dL — AB (ref 65–99)
Potassium: 3.8 mmol/L (ref 3.5–5.1)
SODIUM: 138 mmol/L (ref 135–145)

## 2015-05-11 LAB — POCT PREGNANCY, URINE: PREG TEST UR: NEGATIVE

## 2015-05-11 MED ORDER — MORPHINE SULFATE (PF) 4 MG/ML IV SOLN
4.0000 mg | Freq: Once | INTRAVENOUS | Status: AC
  Administered 2015-05-11: 4 mg via INTRAVENOUS

## 2015-05-11 MED ORDER — TAMSULOSIN HCL 0.4 MG PO CAPS: 0.4000 mg | ORAL_CAPSULE | Freq: Every day | ORAL | Status: DC

## 2015-05-11 MED ORDER — SODIUM CHLORIDE 0.9 % IV BOLUS (SEPSIS)
1000.0000 mL | Freq: Once | INTRAVENOUS | Status: AC
Start: 2015-05-11 — End: 2015-05-11
  Administered 2015-05-11: 1000 mL via INTRAVENOUS

## 2015-05-11 MED ORDER — MORPHINE SULFATE (PF) 4 MG/ML IV SOLN
INTRAVENOUS | Status: AC
  Administered 2015-05-11: 4 mg via INTRAVENOUS
  Filled 2015-05-11: qty 1

## 2015-05-11 MED ORDER — KETOROLAC TROMETHAMINE 30 MG/ML IJ SOLN
INTRAMUSCULAR | Status: AC
  Administered 2015-05-11: 30 mg via INTRAVENOUS
  Filled 2015-05-11: qty 1

## 2015-05-11 MED ORDER — ONDANSETRON HCL 4 MG/2ML IJ SOLN
INTRAMUSCULAR | Status: AC
  Administered 2015-05-11: 4 mg via INTRAVENOUS
  Filled 2015-05-11: qty 2

## 2015-05-11 MED ORDER — OXYCODONE-ACETAMINOPHEN 5-325 MG PO TABS: 1.0000 | ORAL_TABLET | Freq: Four times a day (QID) | ORAL | Status: DC | PRN

## 2015-05-11 MED ORDER — ONDANSETRON HCL 4 MG/2ML IJ SOLN
4.0000 mg | Freq: Once | INTRAMUSCULAR | Status: AC
  Administered 2015-05-11: 4 mg via INTRAVENOUS

## 2015-05-11 MED ORDER — KETOROLAC TROMETHAMINE 30 MG/ML IJ SOLN
30.0000 mg | Freq: Once | INTRAMUSCULAR | Status: AC
  Administered 2015-05-11: 30 mg via INTRAVENOUS

## 2015-05-11 NOTE — ED Notes (Addendum)
Right side flank pain with blood in urine. Pain started last night blood in urine started today. Pt also reports that started today. History of kidney stones

## 2015-05-11 NOTE — ED Notes (Signed)
Pt states right sided flank pain, states blood in urine, hx of kidney stones

## 2015-05-11 NOTE — ED Provider Notes (Signed)
[facility] Emergency Department Provider Note  Time seen: 5:35 PM  I have reviewed the triage vital signs and the nursing notes.   HISTORY  Chief Complaint Flank Pain    HPI Tara Bruce is a 50 y.o. female with a past medical history of kidney stones, presents the emergency department right flank pain. According to the patient since midnight last night she has developed severe sharp stabbing right flank pain. States one kidney stone in the past 3 years ago. Also noted some blood the last time she urinated but denies dysuria. Denies fever. Does state nausea and vomiting. Describes the pain as severe, sharp in nature located in the right flank.     No past medical history on file.  Patient Active Problem List   Diagnosis Date Noted  . Restless leg syndrome 10/26/2012    Past Surgical History  Procedure Laterality Date  . Cholecystectomy    . Total abdominal hysterectomy w/ bilateral salpingoophorectomy      No current outpatient prescriptions on file.  Allergies Review of patient's allergies indicates no known allergies.  Family History  Problem Relation Age of Onset  . Cancer Mother 31    Colon Cancer  . Cancer Father     Prostate Cancer  . Diabetes Sister   . Diabetes Sister   . Diabetes Sister     Social History Social History  Substance Use Topics  . Smoking status: Never Smoker   . Smokeless tobacco: Never Used  . Alcohol Use: No    Review of Systems Constitutional: Negative for fever. Cardiovascular: Negative for chest pain. Respiratory: Negative for shortness of breath. Gastrointestinal: Positive right flank pain. Positive for nausea and vomiting. Genitourinary: Negative for dysuria. Positive for hematuria. Musculoskeletal: Negative for back pain. Neurological: Negative for headache 10-point ROS otherwise negative.  ____________________________________________   PHYSICAL EXAM:  VITAL SIGNS: ED Triage Vitals  Enc  Vitals Group     BP 05/11/15 1633 140/86 mmHg     Pulse Rate 05/11/15 1633 71     Resp 05/11/15 1633 18     Temp 05/11/15 1633 98.3 F (36.8 C)     Temp Source 05/11/15 1633 Oral     SpO2 05/11/15 1633 98 %     Weight 05/11/15 1633 139 lb (63.05 kg)     Height 05/11/15 1633 5\' 2"  (1.575 m)     Head Cir --      Peak Flow --      Pain Score 05/11/15 1635 10     Pain Loc --      Pain Edu? --      Excl. in [place]? --     Constitutional: Alert and oriented. Well appearing and in no distress. Eyes: Normal exam ENT   Head: Normocephalic and atraumatic.   Mouth/Throat: Mucous membranes are moist. Cardiovascular: Normal rate, regular rhythm. No murmurs, rubs, or gallops. Respiratory: Normal respiratory effort without tachypnea nor retractions. Breath sounds are clear Gastrointestinal: Soft, mild right abdominal tenderness. No rebound or guarding. No distention. No CVA tenderness. Musculoskeletal: Nontender with normal range of motion in all extremities.  Neurologic:  Normal speech and language. No gross focal neurologic deficits Skin:  Skin is warm, dry and intact.  Psychiatric: Mood and affect are normal. Speech and behavior are normal.   ____________________________________________     RADIOLOGY  CT consistent with 7 mm stone at the right UVJ.   INITIAL IMPRESSION / ASSESSMENT AND PLAN / ED COURSE  Pertinent labs & imaging  results that were available during my care of the patient were reviewed by me and considered in my medical decision making (see chart for details).  Patient presents with right flank pain since midnight along with nausea, vomiting, hematuria. Patient has a history of a kidney stone in the past. Labs show normal BMP, red blood cells within the urine. We'll proceed with a CT abdomen/pelvis to further evaluate and to confirm ureterolithiasis. We will treat the patient's pain, nausea, and IV hydrate while awaiting CT results.  CT shows ureterolithiasis with a  7 mm stone at the right UVJ. Patient states her pain is currently a 0/10. We will discharge with pain medication, Flomax, and the patient has follow-up with urology. Patient agreeable to plan.  ____________________________________________   FINAL CLINICAL IMPRESSION(S) / ED DIAGNOSES  Right flank pain ureterolithiasis    Harvest Dark, MD 05/11/15 1904

## 2015-05-11 NOTE — ED Notes (Signed)
Report to Rachel, RN.

## 2015-05-11 NOTE — Discharge Instructions (Signed)
Kidney Stones °Kidney stones (urolithiasis) are deposits that form inside your kidneys. The intense pain is caused by the stone moving through the urinary tract. When the stone moves, the ureter goes into spasm around the stone. The stone is usually passed in the urine.  °CAUSES  °· A disorder that makes certain neck glands produce too much parathyroid hormone (primary hyperparathyroidism). °· A buildup of uric acid crystals, similar to gout in your joints. °· Narrowing (stricture) of the ureter. °· A kidney obstruction present at birth (congenital obstruction). °· Previous surgery on the kidney or ureters. °· Numerous kidney infections. °SYMPTOMS  °· Feeling sick to your stomach (nauseous). °· Throwing up (vomiting). °· Blood in the urine (hematuria). °· Pain that usually spreads (radiates) to the groin. °· Frequency or urgency of urination. °DIAGNOSIS  °· Taking a history and physical exam. °· Blood or urine tests. °· CT scan. °· Occasionally, an examination of the inside of the urinary bladder (cystoscopy) is performed. °TREATMENT  °· Observation. °· Increasing your fluid intake. °· Extracorporeal shock wave lithotripsy--This is a noninvasive procedure that uses shock waves to break up kidney stones. °· Surgery may be needed if you have severe pain or persistent obstruction. There are various surgical procedures. Most of the procedures are performed with the use of small instruments. Only small incisions are needed to accommodate these instruments, so recovery time is minimized. °The size, location, and chemical composition are all important variables that will determine the proper choice of action for you. Talk to your health care provider to better understand your situation so that you will minimize the risk of injury to yourself and your kidney.  °HOME CARE INSTRUCTIONS  °· Drink enough water and fluids to keep your urine clear or pale yellow. This will help you to pass the stone or stone fragments. °· Strain  all urine through the provided strainer. Keep all particulate matter and stones for your health care provider to see. The stone causing the pain may be as small as a grain of salt. It is very important to use the strainer each and every time you pass your urine. The collection of your stone will allow your health care provider to analyze it and verify that a stone has actually passed. The stone analysis will often identify what you can do to reduce the incidence of recurrences. °· Only take over-the-counter or prescription medicines for pain, discomfort, or fever as directed by your health care provider. °· Keep all follow-up visits as told by your health care provider. This is important. °· Get follow-up X-rays if required. The absence of pain does not always mean that the stone has passed. It may have only stopped moving. If the urine remains completely obstructed, it can cause loss of kidney function or even complete destruction of the kidney. It is your responsibility to make sure X-rays and follow-ups are completed. Ultrasounds of the kidney can show blockages and the status of the kidney. Ultrasounds are not associated with any radiation and can be performed easily in a matter of minutes. °· Make changes to your daily diet as told by your health care provider. You may be told to: °¨ Limit the amount of salt that you eat. °¨ Eat 5 or more servings of fruits and vegetables each day. °¨ Limit the amount of meat, poultry, fish, and eggs that you eat. °· Collect a 24-hour urine sample as told by your health care provider. You may need to collect another urine sample every 6-12   months. °SEEK MEDICAL CARE IF: °· You experience pain that is progressive and unresponsive to any pain medicine you have been prescribed. °SEEK IMMEDIATE MEDICAL CARE IF:  °· Pain cannot be controlled with the prescribed medicine. °· You have a fever or shaking chills. °· The severity or intensity of pain increases over 18 hours and is not  relieved by pain medicine. °· You develop a new onset of abdominal pain. °· You feel faint or pass out. °· You are unable to urinate. °  °This information is not intended to replace advice given to you by your health care provider. Make sure you discuss any questions you have with your health care provider. °  °Document Released: 03/15/2005 Document Revised: 12/04/2014 Document Reviewed: 08/16/2012 °Elsevier Interactive Patient Education ©2016 [employer] ° °

## 2015-05-13 LAB — URINE CULTURE

## 2015-05-14 ENCOUNTER — Encounter: Payer: Self-pay | Admitting: Obstetrics and Gynecology

## 2015-05-14 ENCOUNTER — Ambulatory Visit (INDEPENDENT_AMBULATORY_CARE_PROVIDER_SITE_OTHER): Payer: BLUE CROSS/BLUE SHIELD | Admitting: Obstetrics and Gynecology

## 2015-05-14 VITALS — BP 121/80 | HR 67 | Resp 16 | Ht 62.0 in | Wt 140.7 lb

## 2015-05-14 DIAGNOSIS — N200 Calculus of kidney: Secondary | ICD-10-CM

## 2015-05-14 DIAGNOSIS — R310 Gross hematuria: Secondary | ICD-10-CM

## 2015-05-14 LAB — MICROSCOPIC EXAMINATION
Bacteria, UA: NONE SEEN
Epithelial Cells (non renal): >10 /hpf — AB (ref 0–10)
WBC, UA: >30 /hpf — AB (ref 0–?)

## 2015-05-14 LAB — URINALYSIS, COMPLETE
BILIRUBIN UA: NEGATIVE
Glucose, UA: NEGATIVE
KETONES UA: NEGATIVE
NITRITE UA: NEGATIVE
Protein, UA: NEGATIVE
SPEC GRAV UA: 1.015 (ref 1.005–1.030)
Urobilinogen, Ur: 0.2 mg/dL (ref 0.2–1.0)
pH, UA: 5.5 (ref 5.0–7.5)

## 2015-05-14 MED ORDER — ONDANSETRON HCL 4 MG PO TABS: 4.0000 mg | ORAL_TABLET | Freq: Three times a day (TID) | ORAL | Status: DC | PRN

## 2015-05-14 NOTE — Progress Notes (Signed)
05/14/2015 11:34 AM   Tara Bruce [birth year] [id number]  Referring provider: Orlena Sheldon, PA-C [place], [place] [address]  Chief Complaint  Patient presents with  . Nephrolithiasis  . Establish Care    HPI: Patient is a 50 year old Hispanic female with history of kidney stones presenting today with Spanish interpreter for follow-up after being seen in the emergency room 05/11/15 for acute onset of right-sided flank pain. CT renal stone study demonstrated bilateral nonobstructing renal calculi as well as a 7 mm right UVJ stone with associated fullness of the right renal collecting system and right ureter. She was discharged with a prescription for Flomax as well as Percocet.   She did experience 1 episode of gross hematuria.  No fevers.  Has been vomiting since Sunday.  Able to keep down liquids but no solids.  1 previous stone episode 3 years ago which she passed spontaneously.   H2O intake 4 16oz bottles daily.   Denies cardiac or pulmonary issues.  She does not take any blood thinning medications.  PMH: No past medical history on file.  Surgical History: Past Surgical History  Procedure Laterality Date  . Cholecystectomy    . Total abdominal hysterectomy w/ bilateral salpingoophorectomy      Home Medications:    Medication List       This list is accurate as of: 05/14/15 11:34 AM.  Always use your most recent med list.               oxyCODONE-acetaminophen 5-325 MG tablet  Commonly known as:  ROXICET  Take 1 tablet by mouth every 6 (six) hours as needed.     tamsulosin 0.4 MG Caps capsule  Commonly known as:  FLOMAX  Take 1 capsule (0.4 mg total) by mouth daily.        Allergies: No Known Allergies  Family History: Family History  Problem Relation Age of Onset  . Cancer Mother 59    Colon Cancer  . Cancer Father     Prostate Cancer  . Diabetes Sister   . Diabetes Sister   . Diabetes Sister     Social History:  reports that  she has never smoked. She has never used smokeless tobacco. She reports that she does not drink alcohol or use illicit drugs.  ROS: UROLOGY Frequent Urination?: No Hard to postpone urination?: No Burning/pain with urination?: Yes Get up at night to urinate?: No Leakage of urine?: No Urine stream starts and stops?: No Trouble starting stream?: No Do you have to strain to urinate?: No Blood in urine?: No Urinary tract infection?: No Sexually transmitted disease?: No Injury to kidneys or bladder?: No Painful intercourse?: No Weak stream?: No Currently pregnant?: No Vaginal bleeding?: No Last menstrual period?: n  Gastrointestinal Nausea?: Yes Vomiting?: Yes Indigestion/heartburn?: No Diarrhea?: No Constipation?: No  Constitutional Fever: No Night sweats?: No Weight loss?: No Fatigue?: No  Skin Skin rash/lesions?: No Itching?: No  Eyes Blurred vision?: No Double vision?: No  Ears/Nose/Throat Sore throat?: No Sinus problems?: Yes  Hematologic/Lymphatic Swollen glands?: No Easy bruising?: No  Cardiovascular Leg swelling?: No Chest pain?: No  Respiratory Cough?: No Shortness of breath?: No  Endocrine Excessive thirst?: No  Musculoskeletal Back pain?: No Joint pain?: No  Neurological Headaches?: No Dizziness?: Yes  Psychologic Depression?: No Anxiety?: No  Physical Exam: BP 121/80 mmHg  Pulse 67  Resp 16  Ht 5\' 2"  (1.575 m)  Wt 140 lb 11.2 oz (63.821 kg)  BMI 25.73  kg/m2  Constitutional:  Alert and oriented, No acute distress. HEENT: [place] AT, moist mucus membranes.  Trachea midline, no masses. Cardiovascular: No clubbing, cyanosis, or edema. Respiratory: Normal respiratory effort, no increased work of breathing. GI: Abdomen is soft, nontender, nondistended, no abdominal masses GU: Right CVA tenderness.  Skin: No rashes, bruises or suspicious lesions. Lymph: No cervical or inguinal adenopathy. Neurologic: Grossly intact, no focal deficits,  moving all 4 extremities. Psychiatric: Normal mood and affect.  Laboratory Data:  Lab Results  Component Value Date   WBC 4.8 11/01/2014   HGB 14.6 11/01/2014   HCT 42.2 11/01/2014   MCV 94.6 11/01/2014   PLT 286 11/01/2014    Lab Results  Component Value Date   CREATININE 0.90 05/11/2015    No results found for: PSA  No results found for: TESTOSTERONE  No results found for: HGBA1C  Urinalysis    Component Value Date/Time   COLORURINE STRAW* 05/11/2015 1654   APPEARANCEUR HAZY* 05/11/2015 1654   LABSPEC 1.010 05/11/2015 1654   PHURINE 8.0 05/11/2015 1654   GLUCOSEU NEGATIVE 05/11/2015 1654   HGBUR 3+* 05/11/2015 1654   BILIRUBINUR NEGATIVE 05/11/2015 1654   KETONESUR NEGATIVE 05/11/2015 1654   PROTEINUR NEGATIVE 05/11/2015 1654   UROBILINOGEN 0.2 03/16/2012 1725   NITRITE NEGATIVE 05/11/2015 1654   LEUKOCYTESUR NEGATIVE 05/11/2015 1654    Pertinent Imaging: CLINICAL DATA: Right-sided flank pain and hematuria  EXAM: CT ABDOMEN AND PELVIS WITHOUT CONTRAST  TECHNIQUE: Multidetector CT imaging of the abdomen and pelvis was performed following the standard protocol without IV contrast.  COMPARISON: None.  FINDINGS: Lung bases are free of acute infiltrate or sizable effusion.  The liver is fatty infiltrated. The gallbladder has been surgically removed. The spleen, adrenal glands and pancreas are all within normal limits.  The left kidney demonstrates multiple renal calculi without obstructive change. The largest of these lies in the lower pole measuring 11 mm in greatest dimension. Right kidney also demonstrates multiple nonobstructing renal calculi. The largest of these lies within the midportion of the right kidney measuring 6 mm in greatest dimension. There is fullness of the right renal collecting system and right ureter which extends to the level of the ureterovesical junction. At the right UVJ, there is a 7 mm stone identified causing the  obstructive change.  The bladder is partially distended. No pelvic mass lesion is seen. The uterus has been surgically removed. No other focal abnormality is noted.  IMPRESSION: 7 mm right UVJ stone with obstructive change.  Bilateral nonobstructing renal stones as described.  Fatty liver.   Electronically Signed  By: Inez Catalina M.D.  On: 05/11/2015 18:20  Assessment & Plan:   1. Nephrolithiasis 7 mm right UVJ stone with obstructive change. Bilateral nonobstructing renal stones as described. We discussed various treatment options including ESWL vs. ureteroscopy, laser lithotripsy, and stent. We discussed the risks and benefits of both including bleeding, infection, damage to surrounding structures, efficacy with need for possible further intervention, and need for temporary ureteral stent. Given obstructing stone location as well as additional stone burden I recommended that patient had a ureteroscopy and stent placement. I advised her that the surgeon may be able to clear some of her of nonobstructing renal stones that she most likely will have to undergo a staged procedure for removal of all of her renal stones. Risks of surgery reviewed including bleeding, anesthesia risks, infection and damage to surrounding organs. - Urinalysis, Complete - CULTURE, URINE COMPREHENSIVE - Continue Flomax daily and Percocet prn.  Sent in prescription for Zofran for N/V.  2. Gross hematuria - Patient experienced one episode of gross hematuria most likely related to her obstructing renal stone. CT scan was performed without contrast dye. We will proceed with cystoscopy with bilateral retrograde pyelograms when patient undergoes ureteroscopy for obstructing stone removal.  No Follow-up on file.  These notes generated with voice recognition software. I apologize for typographical errors.  Herbert Moors, [facility] [address] [place],  [place] [address] [phone]

## 2015-05-14 NOTE — Patient Instructions (Signed)
Implante de un stent ureteral - Cuidados posteriores (Ureteral Stent Implantation, Care After) Siga estas instrucciones durante las prximas semanas. Estas indicaciones le proporcionan informacin general acerca de cmo deber cuidarse despus del procedimiento. El mdico tambin podr darle instrucciones ms especficas. El tratamiento se ha planificado de acuerdo a las prcticas mdicas actuales, pero a veces se producen problemas. Comunquese con el mdico si tiene algn problema o tiene dudas despus del procedimiento. QU ESPERAR DESPUS DEL PROCEDIMIENTO Debe volver a sus actividades normales dentro de las 27 horas del procedimiento. Puede sentir nuseas y vmitos y con frecuencia son el resultado de la anestesia. Es comn experimentar un dolor agudo en la espalda o la zona inferior del abdomen y el pene al Garment/textile technologist. La causa es el movimiento de los extremos del stent con el acto de Garment/textile technologist.Generalmente desaparece luego de algunos minutos de dejar de Garment/textile technologist. INSTRUCCIONES PARA EL CUIDADO EN EL HOGAR Asegrese de beber lquido en abundancia. Podr presentar pequeas hemorragias que harn que la orina se vea roja. Esto es normal. Algunos movimientos podrn hacerle sentir dolor o necesidad de Garment/textile technologist. Le indicarn medicamentos para prevenir la infeccin o los espasmos en la vejiga. Asegrese de tomar todos los medicamentos tal como se los han prescripto. Slo tome medicamentos de venta libre o recetados para Glass blower/designer, Health and safety inspector o bajar la fiebre, segn las indicaciones de su mdico. No tome aspirinas, ya que puede [employer]. El stent se deja hasta que la obstruccin est resuelta. Esto puede demorar 2 semanas o ms, segn el motivo del implante del stent. Le harn un estudio radiogrfico para asegurarse de que el urter est abierto y que el stent no se ha movido de Chief of Staff (migrado). El stent puede ser retirado en el consultorio del profesional. Marin Comment administrarn medicamentos para  que est confortable cuando le quiten el stent. Asegrese de cumplir con todas las consultas de seguimiento, de modo que el profesional pueda controlar que usted se est curando adecuadamente. SOLICITE ATENCIN MDICA SI: Engineer, water. Los medicamentos para Best boy no Teaching laboratory technician. SOLICITE ATENCIN MDICA DE INMEDIATO SI: La orina es de color rojo oscuro o presenta cogulos. Tiene prdida de orina (incontinencia) Siente escalofros ganas de vomitar (nuseas) o vmitos. El dolor no se alivia con los [employer]. El extremo del stent se sale de Geologist, engineering. No puede orinar.   Esta informacin no tiene Marine scientist el consejo del mdico. Asegrese de hacerle al mdico cualquier pregunta que tenga.   Document Released: 11/15/2012 Elsevier Interactive Patient Education 2016 [place] un stent ureteral (Ureteral Stent Implantation) El implante de un stent ureteral es la colocacin de un tubo de plstico blando con mltiples orificios que drena la orina desde el rin a la vejiga (urter). El stent ayuda a drenar los riones cuando hay una obstruccin en el flujo de orina del urter. El stent tiene un espiral en cada extremo para impedir que se caiga. Uno de los extremos se inserta en el rin. El otro extremo se coloca en la vejiga. Habitualmente se retira luego que se ha eliminado la obstruccin o el urter se ha curado. Los stent temporarios tienen un cordn adherido para Investment banker, corporate. La extraccin de un stent temporario puede realizarse en el consultorio mdico o en el domicilio. Los stent permanentes deben cambiarse cada pocos meses. INFORME A SU MDICO:  Cualquier alergia que tenga.  Todos los [employer] [place], incluyendo vitaminas, hierbas, gotas oftlmicas, cremas y [place] de  venta libre.  Problemas previos que usted o los [employer] de su familia hayan tenido con el uso de anestsicos.  Enfermedades de [employer].  Cirugas  previas.  Padecimientos mdicos. RIESGOS Y COMPLICACIONES Generalmente, el implante de un stent ureteral es un procedimiento seguro. Sin embargo, Games developer procedimiento, pueden surgir complicaciones. Las complicaciones posibles son:  El stent se sale de su ubicacin original (migracin). Esto puede afectar la capacidad del stent de drenar adecuadamente el rin. Si se produce la migracin del stent ser necesario reemplazarlo o volver a colocarlo.  Perforacin del urter.  Infeccin. ANTES DEL PROCEDIMIENTO  Le indicarn que higienice su zona genital con un jabn estril en la maana del procedimiento.  Podrn recetarle antibiticos por va oral que deber tomar con un sorbo de agua segn las indicaciones del mdico.  No debe comer ni beber nada durante las 8 horas previas al procedimiento. PROCEDIMIENTO  Primero le administrarn un anestsico de modo que no sienta dolor durante el procedimiento.  El mdico insertar un instrumento luminoso especial llamado citoscopio en la vejiga. Esto permite al mdico observar la abertura del urter.  Luego se inserta un alambre delgado en la vejiga hasta el urter. El stent se inserta sobre el alambre y [place] ste se retira.  Se vaciar la orina de la vejiga. DESPUS DEL PROCEDIMIENTO Lo llevarn a una sala de recuperacin hasta que se encuentre bien para volver a su casa.    Esta informacin no tiene Marine scientist el consejo del mdico. Asegrese de hacerle al mdico cualquier pregunta que tenga.   Document Released: 12/23/2004 Document Revised: 03/20/2013 Elsevier Interactive Patient Education 2016 [place] renales (Kidney Stones) Los clculos renales (urolitiasis) son masas slidas que se forman en el interior de los riones. El dolor intenso es causado por el movimiento de la piedra a travs del tracto urinario. Cuando la piedra se mueve, el urter hace un espasmo alrededor de la misma. El clculo  generalmente se elimina con la [place].  CAUSAS   Un trastorno que hace que ciertas glndulas del cuello produzcan demasiada hormona paratiroidea (hiperparatiroidismo primario).  Una acumulacin de cristales de cido rico, similar a Psychiatric nurse.  Estrechamiento (constriccin) del urter.  Obstruccin en el rin presente al nacer (obstruccin congnita).  Cirugas previas del rin o los urteres.  Numerosas infecciones renales. SNTOMAS   Ganas de vomitar (nuseas).  Devolver la comida (vomitar).  Sangre en la orina (hematuria).  Dolor que generalmente se expande (irradia) hacia la ingle.  Ganas de orinar con frecuencia o de manera urgente. DIAGNSTICO   Historia clnica y examen fsico.  Anlisis de sangre y [place].  Tomografa computada.  En algunos casos se realiza un examen del interior de la vejiga (citoscopa). TRATAMIENTO   Observacin.  Aumentar la ingesta de lquidos.  Litotricia extracorprea con ondas de choque: es un procedimiento no invasivo que [place] ondas de choque para romper los clculos renales.  Ser necesaria la ciruga si tiene dolor muy intenso o la obstruccin persiste. Hay varios procedimientos quirrgicos. La mayora de los procedimientos se realizan con el uso de pequeos instrumentos. Slo es Chartered loss adjuster pequeas incisiones para acomodar estos instrumentos, por lo tanto el tiempo de recuperacin es mnimo. El tamao, la ubicacin y la composicin qumica de los clculos son variables importantes que determinarn la eleccin correcta de tratamiento para su caso. Comunquese con su mdico para comprender mejor su situacin, de modo que pueda [employer] de lesiones para usted y  su rin.  INSTRUCCIONES PARA EL CUIDADO EN EL HOGAR   Beba gran cantidad de lquido para mantener la orina de tono claro o color amarillo plido. Esto ayudar a eliminar las piedras o los fragmentos.  Cuele la orina con el colador que  le han provisto. Guarde todas las partculas y piedras para que las vea el profesional que lo asiste. Puede ser tan pequea como un grano de sal. Es muy importante usar el colador cada vez que orine. La recoleccin de piedras permitir al Medtronic y Musician que efectivamente ha eliminado una piedra. El anlisis de la piedra con frecuencia permitir identificar qu puede hacer para reducir la incidencia de las recurrencias.  Slo tome medicamentos de venta libre o recetados para Glass blower/designer, Health and safety inspector o bajar la fiebre, segn las indicaciones de su mdico.  Consulting civil engineer a todas las visitas de control como se lo haya indicado el mdico. Esto es importante.  Si se lo indica, hgase radiografas. La ausencia de dolor no siempre significa que las piedras se han eliminado. Puede ser que simplemente hayan dejado de moverse. Si el paso de orina permanece completamente obstruido, puede causar prdida de la funcin renal o simplemente la destruccin del rin. Es su responsabilidad Artist seguimiento y las radiografas. Las ecografas del rin pueden mostrar una obstruccin y el estado del rin. Las ecografas no se asocian con la radiacin y pueden realizarse fcilmente en cuestin de minutos.  Haga cambios en la dieta diaria como se lo haya indicado el mdico. Es posible que le indiquen lo siguiente:  Limitar la cantidad de sal que consume.  Consumir 5 o ms porciones de frutas y Set designer.  Limitar la cantidad de carne, carne de ave, pescado y [employer] consume.  Recoger Truddie Coco de orina durante 24 horas como se lo haya indicado el mdico. Tal vez tenga que recoger otra muestra de orina cada 6 o 12 meses. SOLICITE ATENCIN MDICA SI:  Siente dolor que no responde a los Recruitment consultant. SOLICITE ATENCIN MDICA DE INMEDIATO SI:   No puede controlar el dolor con los medicamentos que le han recetado.  Siente escalofros o fiebre.  La gravedad o la  intensidad del dolor aumenta durante 18 horas y no se Engineer, production con los analgsicos.  Presenta un nuevo episodio de dolor abdominal.  Sufre mareos o se desmaya.  No puede orinar.   Esta informacin no tiene Marine scientist el consejo del mdico. Asegrese de hacerle al mdico cualquier pregunta que tenga.   Document Released: 03/15/2005 Document Revised: 12/04/2014 Elsevier Interactive Patient Education [employer].

## 2015-05-15 ENCOUNTER — Telehealth: Payer: Self-pay | Admitting: Radiology

## 2015-05-15 NOTE — Telephone Encounter (Signed)
Notified pt's husband of surgery scheduled 06/09/15, pre-admit testing appt on 3/2 @9 :30 and to call Friday prior to surgery for arrival time to SDS. Husband voices understanding.

## 2015-05-16 LAB — CULTURE, URINE COMPREHENSIVE

## 2015-05-29 ENCOUNTER — Encounter
Admission: RE | Admit: 2015-05-29 | Discharge: 2015-05-29 | Disposition: A | Discharge status: Home / Self Care | Payer: BLUE CROSS/BLUE SHIELD | Source: Ambulatory Visit | Attending: Urology | Admitting: Urology

## 2015-05-29 DIAGNOSIS — Z01818 Encounter for other preprocedural examination: Secondary | ICD-10-CM | POA: Diagnosis present

## 2015-05-29 NOTE — Patient Instructions (Signed)
  Your procedure is scheduled on: June 09, 2015 (Monday) Report to Day Surgery.[facility]) Second Floor To find out your arrival time please call [phone] between 1PM - 3PM on June 06, 2015 (Friday).  Remember: Instructions that are not followed completely may result in serious medical risk, up to and including death, or upon the discretion of your surgeon and anesthesiologist your surgery may need to be rescheduled.    __x__ 1. Do not eat food or drink liquids after midnight. No gum chewing or hard candies.     __x__ 2. No Alcohol for 24 hours before or after surgery.   ____ 3. Bring all medications with you on the day of surgery if instructed.    __x__ 4. Notify your doctor if there is any change in your medical condition     (cold, fever, infections).     Do not wear jewelry, make-up, hairpins, clips or nail polish.  Do not wear lotions, powders, or perfumes. You may wear deodorant.  Do not shave 48 hours prior to surgery. Men may shave face and neck.  Do not bring valuables to the hospital.    [facility] is not responsible for any belongings or valuables.               Contacts, dentures or bridgework may not be worn into surgery.  Leave your suitcase in the car. After surgery it may be brought to your room.  For patients admitted to the hospital, discharge time is determined by your                treatment team.   Patients discharged the day of surgery will not be allowed to drive home.   Please read over the following fact sheets that you were given:   Surgical Site Infection Prevention   ____ Take these medicines the morning of surgery with A SIP OF WATER:    1.   2.   3.   4.  5.  6.  ____ Fleet Enema (as directed)   ____ Use CHG Soap as directed  ____ Use inhalers on the day of surgery  ____ Stop metformin 2 days prior to surgery    ____ Take 1/2 of usual insulin dose the night before surgery and none on the morning of surgery.   _x___ Stop  Coumadin/Plavix/aspirin on (N/A)  _x___ Stop Anti-inflammatories on (STOP ADVIL ONE WEEK PRIOR TO SURGERY) TYLENOL OK TO TAKE IF NEEDED   ____ Stop supplements until after surgery.    ____ Bring C-Pap to the hospital.

## 2015-06-16 ENCOUNTER — Ambulatory Visit (INDEPENDENT_AMBULATORY_CARE_PROVIDER_SITE_OTHER): Payer: BLUE CROSS/BLUE SHIELD | Admitting: Urology

## 2015-06-16 ENCOUNTER — Encounter: Payer: Self-pay | Admitting: Urology

## 2015-06-16 VITALS — BP 136/95 | HR 66 | Ht 62.0 in | Wt 143.5 lb

## 2015-06-16 DIAGNOSIS — N200 Calculus of kidney: Secondary | ICD-10-CM

## 2015-06-16 DIAGNOSIS — N132 Hydronephrosis with renal and ureteral calculous obstruction: Secondary | ICD-10-CM | POA: Diagnosis not present

## 2015-06-16 DIAGNOSIS — R310 Gross hematuria: Secondary | ICD-10-CM | POA: Diagnosis not present

## 2015-06-16 LAB — URINALYSIS, COMPLETE
BILIRUBIN UA: NEGATIVE
GLUCOSE, UA: NEGATIVE
KETONES UA: NEGATIVE
Leukocytes, UA: NEGATIVE
Nitrite, UA: NEGATIVE
PH UA: 5.5 (ref 5.0–7.5)
PROTEIN UA: NEGATIVE
SPEC GRAV UA: 1.015 (ref 1.005–1.030)
UUROB: 0.2 mg/dL (ref 0.2–1.0)

## 2015-06-16 LAB — MICROSCOPIC EXAMINATION

## 2015-06-16 NOTE — Progress Notes (Signed)
06/16/2015 4:58 PM   Tara Bruce [birth year] [id number]  Referring provider: Orlena Sheldon, PA-C [place], [place] [address]  Chief Complaint  Patient presents with  . Nephrolithiasis    follow up  . Hematuria    HPI: Patient is a 50 year old Hispanic female who presents today with an interpreter for follow-up after passing a right UVJ stone.  Background story Patient was seen in the emergency room 05/11/15 for acute onset of right-sided flank pain. CT renal stone study demonstrated bilateral nonobstructing renal calculi as well as a 7 mm right UVJ stone with associated fullness of the right renal collecting system and right ureter. She was discharged with a prescription for Flomax as well as Percocet.   She has since passed the stone.  Today, patient states she feels well. She denies any further flank pain, gross hematuria or dysuria. She has not had any fevers, chills, nausea or vomiting. Her UA today is unremarkable.  Her stone will be sent for analysis.   PMH: Past Medical History  Diagnosis Date  . Kidney stone     Surgical History: Past Surgical History  Procedure Laterality Date  . Cholecystectomy    . Total abdominal hysterectomy w/ bilateral salpingoophorectomy    . Laparoscopic oopherectomy  2013    Home Medications:    Medication List       This list is accurate as of: 06/16/15  4:58 PM.  Always use your most recent med list.               ondansetron 4 MG tablet  Commonly known as:  ZOFRAN  Take 1 tablet (4 mg total) by mouth every 8 (eight) hours as needed for nausea or vomiting.     oxyCODONE-acetaminophen 5-325 MG tablet  Commonly known as:  ROXICET  Take 1 tablet by mouth every 6 (six) hours as needed.     tamsulosin 0.4 MG Caps capsule  Commonly known as:  FLOMAX  Take 1 capsule (0.4 mg total) by mouth daily.        Allergies: No Known Allergies  Family History: Family History  Problem Relation Age of Onset  .  Cancer Mother 85    Colon Cancer  . Diabetes Mother   . Cancer Father     Prostate Cancer  . Diabetes Sister   . Diabetes Sister   . Diabetes Sister   . Kidney disease Father     Social History:  reports that she has never smoked. She has never used smokeless tobacco. She reports that she does not drink alcohol or use illicit drugs.  ROS: UROLOGY Frequent Urination?: No Hard to postpone urination?: No Burning/pain with urination?: No Get up at night to urinate?: No Leakage of urine?: No Urine stream starts and stops?: No Trouble starting stream?: No Do you have to strain to urinate?: No Blood in urine?: No Urinary tract infection?: No Sexually transmitted disease?: No Injury to kidneys or bladder?: No Painful intercourse?: No Weak stream?: No Currently pregnant?: No Vaginal bleeding?: No Last menstrual period?: n  Gastrointestinal Nausea?: No Vomiting?: No Indigestion/heartburn?: No Diarrhea?: No Constipation?: No  Constitutional Fever: No Night sweats?: No Weight loss?: No Fatigue?: No  Skin Skin rash/lesions?: No Itching?: No  Eyes Blurred vision?: No Double vision?: No  Ears/Nose/Throat Sore throat?: No Sinus problems?: No  Hematologic/Lymphatic Swollen glands?: No Easy bruising?: No  Cardiovascular Leg swelling?: No Chest pain?: No  Respiratory Cough?: No Shortness of breath?: No  Endocrine Excessive thirst?: No  Musculoskeletal Back pain?: No Joint pain?: No  Neurological Headaches?: No Dizziness?: No  Psychologic Depression?: No Anxiety?: No  Physical Exam: BP 136/95 mmHg  Pulse 66  Ht 5\' 2"  (1.575 m)  Wt 143 lb 8 oz (65.091 kg)  BMI 26.24 kg/m2  Constitutional: Well nourished. Alert and oriented, No acute distress. HEENT: [place] AT, moist mucus membranes. Trachea midline, no masses. Cardiovascular: No clubbing, cyanosis, or edema. Respiratory: Normal respiratory effort, no increased work of breathing. GI: Abdomen is  soft, non tender, non distended, no abdominal masses. Liver and spleen not palpable.  No hernias appreciated.  Stool sample for occult testing is not indicated.   GU: No CVA tenderness.  No bladder fullness or masses.   Skin: No rashes, bruises or suspicious lesions. Lymph: No cervical or inguinal adenopathy. Neurologic: Grossly intact, no focal deficits, moving all 4 extremities. Psychiatric: Normal mood and affect.  Laboratory Data: Lab Results  Component Value Date   WBC 4.8 11/01/2014   HGB 14.6 11/01/2014   HCT 42.2 11/01/2014   MCV 94.6 11/01/2014   PLT 286 11/01/2014   Lab Results  Component Value Date   CREATININE 0.90 05/11/2015   Lab Results  Component Value Date   TSH 2.690 11/01/2014      Component Value Date/Time   CHOL 196 11/01/2014 0814   HDL 50 11/01/2014 0814   CHOLHDL 3.9 11/01/2014 0814   VLDL 28 11/01/2014 0814   LDLCALC 118 11/01/2014 0814   Lab Results  Component Value Date   AST 38* 11/01/2014   Lab Results  Component Value Date   ALT 57* 11/01/2014    Urinalysis Results for orders placed or performed in visit on 06/16/15  CULTURE, URINE COMPREHENSIVE  Result Value Ref Range   Urine Culture, Comprehensive Final report    Result 1 Comment   Microscopic Examination  Result Value Ref Range   WBC, UA 0-5 0 -  5 /hpf   RBC, UA 0-2 0 -  2 /hpf   Epithelial Cells (non renal) 0-10 0 - 10 /hpf   Bacteria, UA Few (A) None seen/Few  Urinalysis, Complete  Result Value Ref Range   Specific Gravity, UA 1.015 1.005 - 1.030   pH, UA 5.5 5.0 - 7.5   Color, UA Yellow Yellow   Appearance Ur Clear Clear   Leukocytes, UA Negative Negative   Protein, UA Negative Negative/Trace   Glucose, UA Negative Negative   Ketones, UA Negative Negative   RBC, UA Trace (A) Negative   Bilirubin, UA Negative Negative   Urobilinogen, Ur 0.2 0.2 - 1.0 mg/dL   Nitrite, UA Negative Negative   Microscopic Examination See below:      Assessment & Plan:    1.  Kidney stones:   Stone sent for analysis.  She'll be contacted with those results.    - Urinalysis, Complete - CULTURE, URINE COMPREHENSIVE - Abdomen 1 view (KUB); Future  2. Hydronephrosis with urinary obstruction due to ureteral calculus:   Patient will be scheduled for a renal ultrasound to ensure the hydronephrosis has resolved.  - US Renal; Future  3. Gross hematuria:   She passed her stone spontaneously, she will not be undergoing ureteroscopic. We will continue to monitor her urine. She does not endorse any further gross hematuria and today's UA is negative for hematuria   Return for I will call patient with results.  These notes generated with voice recognition software. I apologize for typographical errors.  Misael Mcgaha  [place], [facility] [address] [place], [place] [address] [phone]

## 2015-06-17 ENCOUNTER — Ambulatory Visit: Admission: RE | Admit: 2015-06-17 | Payer: BLUE CROSS/BLUE SHIELD | Source: Ambulatory Visit | Admitting: Urology

## 2015-06-17 ENCOUNTER — Encounter: Admission: RE | Payer: Self-pay | Source: Ambulatory Visit

## 2015-06-17 SURGERY — URETEROSCOPY, WITH LITHOTRIPSY USING HOLMIUM LASER
Anesthesia: Choice | Laterality: Right

## 2015-06-18 LAB — CULTURE, URINE COMPREHENSIVE

## 2015-06-19 DIAGNOSIS — N132 Hydronephrosis with renal and ureteral calculous obstruction: Secondary | ICD-10-CM | POA: Insufficient documentation

## 2015-06-19 DIAGNOSIS — N200 Calculus of kidney: Secondary | ICD-10-CM | POA: Insufficient documentation

## 2015-06-19 DIAGNOSIS — R310 Gross hematuria: Secondary | ICD-10-CM | POA: Insufficient documentation

## 2015-06-26 ENCOUNTER — Other Ambulatory Visit: Payer: Self-pay | Admitting: Urology

## 2015-07-09 ENCOUNTER — Telehealth: Payer: Self-pay | Admitting: Urology

## 2015-07-09 NOTE — Telephone Encounter (Signed)
Pt's daughter, Langley Gauss, [phone] would like a call concerning her mom's stone analysis report.  Please give her a call.

## 2015-07-14 NOTE — Telephone Encounter (Signed)
It was released for schdling patient just needs to call and have it schd   Tara Bruce

## 2015-07-14 NOTE — Telephone Encounter (Signed)
Patient's stone analysis noted a calcium oxalate stone.  To prevent a stone in the future, she needs to drink 2 L of water daily, increase her intake of orange juice or lemonade and has to be real juice not powdered form, avoiding sodas, reducing salt and protein intake.  A more specific analysis can be done with a 24-hour urine if they are interested.

## 2015-07-14 NOTE — Telephone Encounter (Signed)
Spoke with pt daughter, Langley Gauss, in reference to pt stone analysis. Denise voiced understanding. Langley Gauss inquired about [name] pt was supposed to have but never heard back from anyone. [name] are you able to help out with this? Orders are placed.

## 2015-07-15 NOTE — Telephone Encounter (Signed)
Spoke with pt daughter, Langley Gauss, and made aware [name] has been released and just needed to be scheduled. Made daughter aware what we could tell in the computer pt had been contacted but never returned the call. Daughter stated pt does not speak english therefore she most likely did not understand what was going on. Provided scheduling telephone number for [facility]. Denise voiced understanding.

## 2015-09-15 ENCOUNTER — Ambulatory Visit (INDEPENDENT_AMBULATORY_CARE_PROVIDER_SITE_OTHER): Payer: BLUE CROSS/BLUE SHIELD | Admitting: Family Medicine

## 2015-09-15 ENCOUNTER — Ambulatory Visit (INDEPENDENT_AMBULATORY_CARE_PROVIDER_SITE_OTHER): Payer: BLUE CROSS/BLUE SHIELD

## 2015-09-15 VITALS — BP 122/80 | HR 67 | Temp 98.1°F | Resp 16 | Ht 61.0 in | Wt 150.0 lb

## 2015-09-15 DIAGNOSIS — M79662 Pain in left lower leg: Secondary | ICD-10-CM

## 2015-09-15 DIAGNOSIS — M25552 Pain in left hip: Secondary | ICD-10-CM | POA: Diagnosis not present

## 2015-09-15 MED ORDER — OXYCODONE-ACETAMINOPHEN 5-325 MG PO TABS: 1.0000 | ORAL_TABLET | Freq: Four times a day (QID) | ORAL | Status: DC | PRN

## 2015-09-15 MED ORDER — DICLOFENAC SODIUM 75 MG PO TBEC: 75.0000 mg | DELAYED_RELEASE_TABLET | Freq: Two times a day (BID) | ORAL | Status: DC

## 2015-09-15 NOTE — Patient Instructions (Addendum)
The x-rays do not show any fracture but there is some arthritis. I suspect that you strain both the calf and the left hip. I would like you to come back on Friday to recheck these areas.  In the meantime I have written for some anti-inflammatory medicine and pain medicine.  IF you received an x-ray today, you will receive an invoice from [facility]. Please contact [facility] at [phone] with questions or concerns regarding your invoice.   IF you received labwork today, you will receive an invoice from [employer]. Please contact [facility] at [phone] with questions or concerns regarding your invoice.   Our billing staff will not be able to assist you with questions regarding bills from these companies.  You will be contacted with the lab results as soon as they are available. The fastest way to get your results is to activate your My Chart account. Instructions are located on the last page of this paperwork. If you have not heard from [place] regarding the results in 2 weeks, please contact this office.

## 2015-09-15 NOTE — Progress Notes (Signed)
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 She was getting out of car on Friday (3 days ago) when she slipped and fell hitting her left hip and straining her left calf. She's had pain in these areas ever since. She's having trouble walking because of the pain.  She notes no swelling in the calf, no chest pain, no shoulder or arm pain. She's had no ecchymosis or abrasion.  Objective:BP 122/80 mmHg  Pulse 67  Temp(Src) 98.1 F (36.7 C) (Oral)  Resp 16  Ht 5\' 1"  (1.549 m)  Wt 150 lb (68.04 kg)  BMI 28.36 kg/m2  SpO2 98% Left hip: Patient has marked pain with palpation over the greater trochanter as well as pain when flexing hip greater than 30. She is unable to internally or externally rotate the hip without significant pain. Left calf: Not swollen or ecchymotic, but she does have pain with deep palpation of the mid gastrocnemius. Left foot: No swelling  [facility] reading (PRIMARY) by  Dr. Joseph Art:.DG HIP (WITH OR WITHOUT PELVIS) 2-3V LEFT  COMPARISON: Coronal and sagittal reconstructed CT images through the pelvis and hips dated May 11, 2015.  FINDINGS: The bony pelvis is subjectively adequately mineralized. There is no acute fracture. The observed portions of the sacrum and SI joints appear normal. AP and lateral views of the left hip reveal mild stable symmetric joint space narrowing. The articular surfaces of the femoral head and acetabulum appear normal. Bony density adjacent to the greater tuberosity appears stable the femoral neck, intertrochanteric, and subtrochanteric regions are normal.  IMPRESSION: There is no acute fracture nor dislocation of the left hip. There is mild joint space narrowing consistent with osteoarthritis.   Electronically Signed  By: David Martinique M.D.  On: 09/15/2015 16:55   Assessment: Hip strain and strain on the left.  Plan: Have patient return in 3 days. Pain medicine and anti-inflammatories  prescribed.  Signed, Carola Frost.D.

## 2015-10-06 ENCOUNTER — Other Ambulatory Visit: Payer: Self-pay | Admitting: Physician Assistant

## 2015-10-06 DIAGNOSIS — Z1231 Encounter for screening mammogram for malignant neoplasm of breast: Secondary | ICD-10-CM

## 2015-10-20 ENCOUNTER — Ambulatory Visit
Admission: RE | Admit: 2015-10-20 | Discharge: 2015-10-20 | Disposition: A | Discharge status: Home / Self Care | Payer: BLUE CROSS/BLUE SHIELD | Source: Ambulatory Visit | Attending: Physician Assistant | Admitting: Physician Assistant

## 2015-10-20 ENCOUNTER — Other Ambulatory Visit: Payer: Self-pay | Admitting: Medical

## 2015-10-20 DIAGNOSIS — Z1231 Encounter for screening mammogram for malignant neoplasm of breast: Secondary | ICD-10-CM

## 2015-10-21 ENCOUNTER — Other Ambulatory Visit: Payer: Self-pay | Admitting: Medical

## 2015-10-21 DIAGNOSIS — R928 Other abnormal and inconclusive findings on diagnostic imaging of breast: Secondary | ICD-10-CM

## 2015-10-29 ENCOUNTER — Other Ambulatory Visit: Payer: BLUE CROSS/BLUE SHIELD

## 2015-10-31 ENCOUNTER — Ambulatory Visit
Admission: RE | Admit: 2015-10-31 | Discharge: 2015-10-31 | Disposition: A | Discharge status: Home / Self Care | Payer: BLUE CROSS/BLUE SHIELD | Source: Ambulatory Visit | Attending: Medical | Admitting: Medical

## 2015-10-31 DIAGNOSIS — R928 Other abnormal and inconclusive findings on diagnostic imaging of breast: Secondary | ICD-10-CM

## 2016-10-13 ENCOUNTER — Other Ambulatory Visit: Payer: Self-pay | Admitting: Medical

## 2016-10-13 DIAGNOSIS — Z1231 Encounter for screening mammogram for malignant neoplasm of breast: Secondary | ICD-10-CM

## 2016-10-30 IMAGING — MG MM SCREENING BREAST TOMO BILATERAL
8 series · 8 of 24 positions shown · non-contrast
Comparison: Previous exam(s).

CLINICAL DATA: Screening.

EXAM:
DIGITAL SCREENING BILATERAL MAMMOGRAM WITH 3D TOMO WITH CAD

[R MLO]
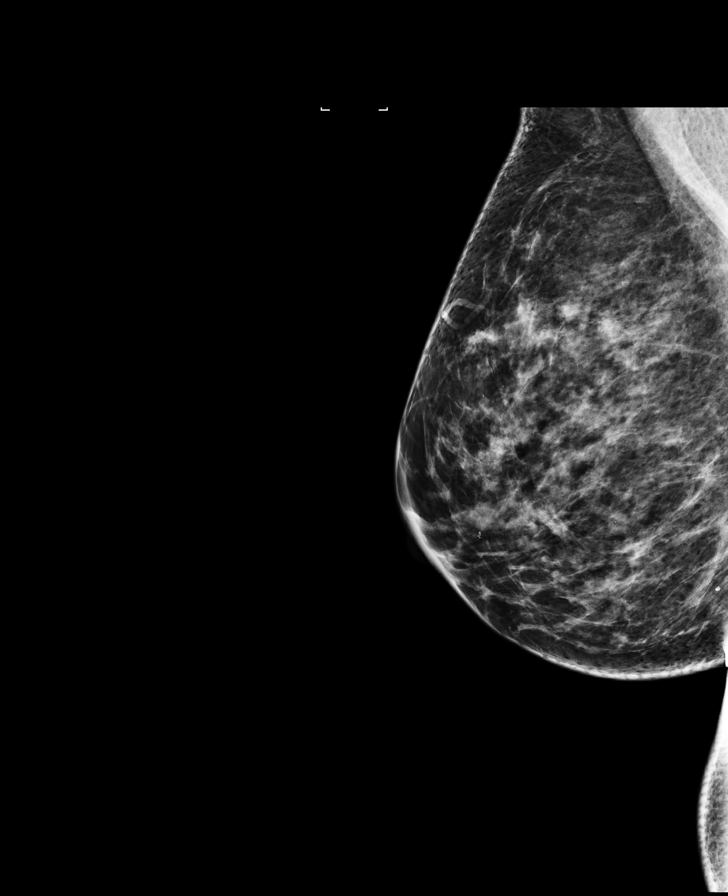

[L MLO]
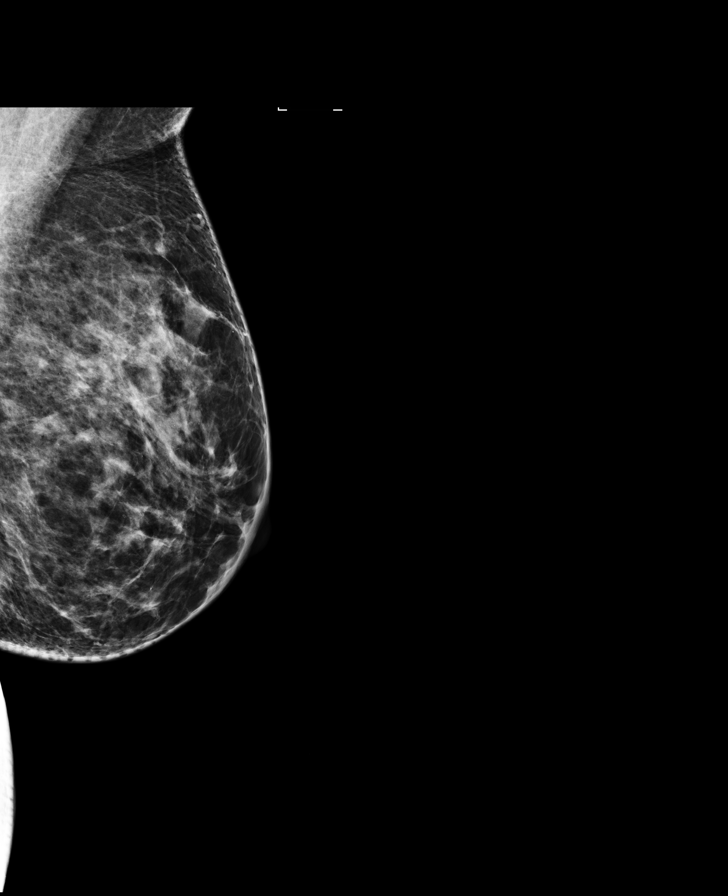

[L CC]
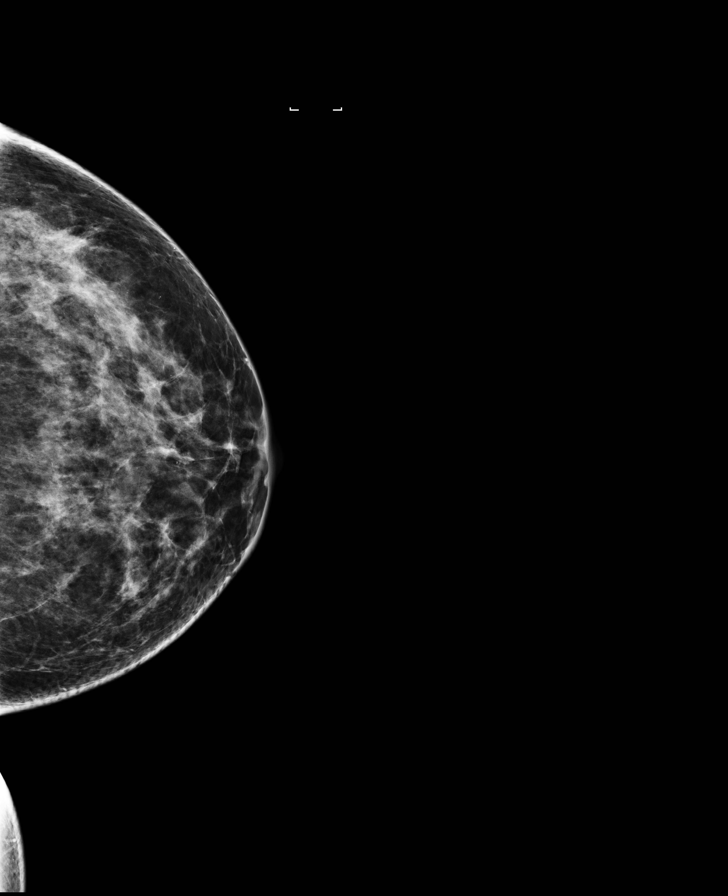

[R CC]
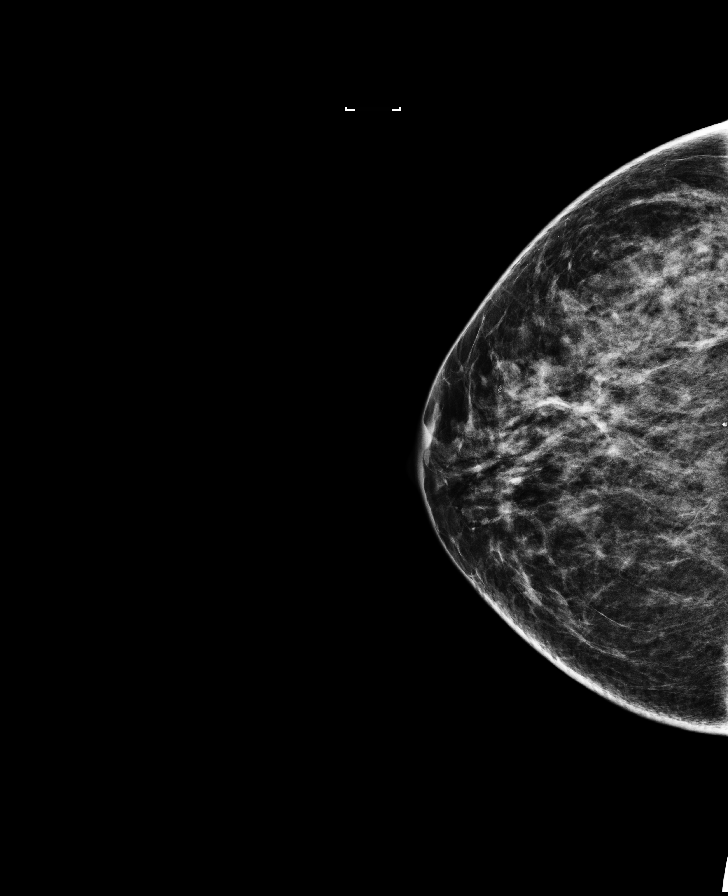

[R MLO tomo · tomo slice 37/74.0]
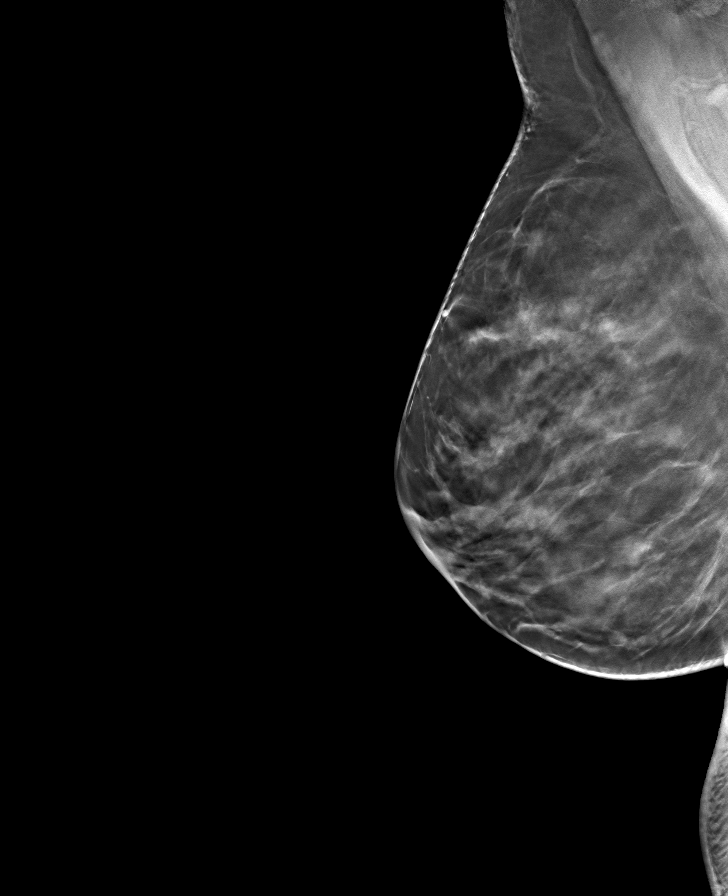

[R CC tomo · tomo slice 35/70.0]
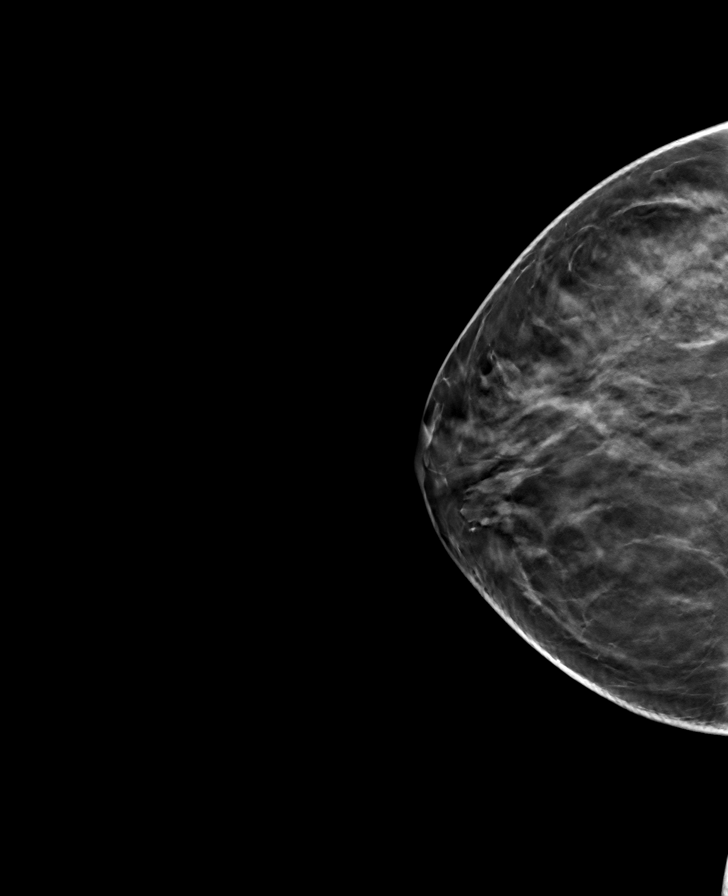

[L MLO tomo · tomo slice 35/70.0]
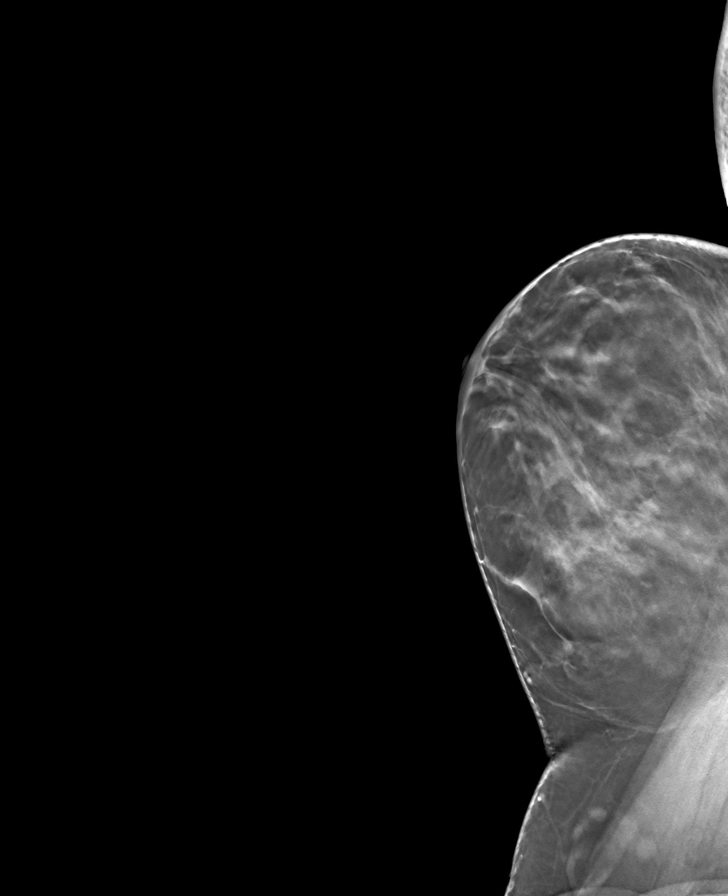

[L CC tomo · tomo slice 31/61.0]
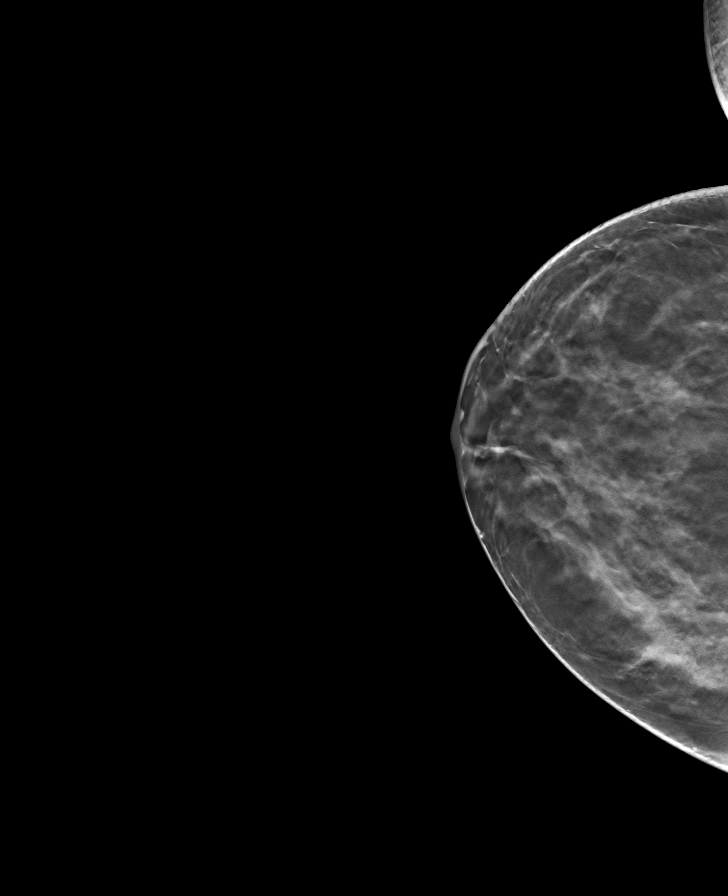

[8 of 24 positions shown; findings below may reference images not displayed]

ACR Breast Density Category c: The breast tissue is heterogeneously
dense, which may obscure small masses.
FINDINGS: There are no findings suspicious for malignancy. Images were
processed with CAD.
IMPRESSION: No mammographic evidence of malignancy. A result letter of this
screening mammogram will be mailed directly to the patient.

RECOMMENDATION:
Screening mammogram in one year. (Code:OA-G-1SS)

BI-RADS CATEGORY  1: Negative.

## 2016-11-04 ENCOUNTER — Ambulatory Visit
Admission: RE | Admit: 2016-11-04 | Discharge: 2016-11-04 | Disposition: A | Discharge status: Home / Self Care | Payer: BLUE CROSS/BLUE SHIELD | Source: Ambulatory Visit | Attending: Medical | Admitting: Medical

## 2016-11-04 DIAGNOSIS — Z1231 Encounter for screening mammogram for malignant neoplasm of breast: Secondary | ICD-10-CM

## 2016-11-28 ENCOUNTER — Telehealth: Payer: Self-pay | Admitting: Urology

## 2016-11-28 DIAGNOSIS — N200 Calculus of kidney: Secondary | ICD-10-CM

## 2016-11-28 NOTE — Telephone Encounter (Signed)
Tara Bruce has not gotten her [name] for follow up after having a kidney stone last year.  Would you please call her and reschedule this?

## 2016-12-02 NOTE — Telephone Encounter (Signed)
New [name] order placed.

## 2016-12-06 NOTE — Telephone Encounter (Signed)
[name] has been scheduled and her follow up app has also been scheduled.  Thanks, [name]

## 2016-12-06 NOTE — Telephone Encounter (Signed)
Left message for daughter Langley Gauss to cb to discuss [phone]   [facility]

## 2016-12-17 ENCOUNTER — Ambulatory Visit: Payer: BLUE CROSS/BLUE SHIELD

## 2016-12-27 ENCOUNTER — Encounter: Payer: Self-pay | Admitting: Urology

## 2016-12-27 ENCOUNTER — Ambulatory Visit: Payer: BLUE CROSS/BLUE SHIELD | Admitting: Urology

## 2017-10-05 ENCOUNTER — Other Ambulatory Visit: Payer: Self-pay | Admitting: Family Medicine

## 2017-10-05 DIAGNOSIS — Z1231 Encounter for screening mammogram for malignant neoplasm of breast: Secondary | ICD-10-CM

## 2017-11-18 ENCOUNTER — Other Ambulatory Visit: Payer: Self-pay | Admitting: Medical

## 2017-11-18 ENCOUNTER — Ambulatory Visit
Admission: RE | Admit: 2017-11-18 | Discharge: 2017-11-18 | Disposition: A | Discharge status: Home / Self Care | Payer: No Typology Code available for payment source | Source: Ambulatory Visit | Attending: Family Medicine | Admitting: Family Medicine

## 2017-11-18 DIAGNOSIS — Z1231 Encounter for screening mammogram for malignant neoplasm of breast: Secondary | ICD-10-CM

## 2018-10-18 ENCOUNTER — Other Ambulatory Visit: Payer: Self-pay | Admitting: Medical

## 2018-10-18 DIAGNOSIS — Z1231 Encounter for screening mammogram for malignant neoplasm of breast: Secondary | ICD-10-CM

## 2018-11-17 ENCOUNTER — Encounter: Payer: Self-pay | Admitting: Gastroenterology

## 2018-11-30 ENCOUNTER — Ambulatory Visit
Admission: RE | Admit: 2018-11-30 | Discharge: 2018-11-30 | Disposition: A | Discharge status: Home / Self Care | Payer: No Typology Code available for payment source | Source: Ambulatory Visit | Attending: Medical | Admitting: Medical

## 2018-11-30 ENCOUNTER — Other Ambulatory Visit: Payer: Self-pay

## 2018-11-30 DIAGNOSIS — Z1231 Encounter for screening mammogram for malignant neoplasm of breast: Secondary | ICD-10-CM

## 2018-12-08 ENCOUNTER — Ambulatory Visit (AMBULATORY_SURGERY_CENTER): Payer: Self-pay | Admitting: *Deleted

## 2018-12-08 ENCOUNTER — Other Ambulatory Visit: Payer: Self-pay

## 2018-12-08 VITALS — Temp 96.9°F | Ht 61.0 in | Wt 141.2 lb

## 2018-12-08 DIAGNOSIS — Z800 Family history of malignant neoplasm of digestive organs: Secondary | ICD-10-CM

## 2018-12-08 DIAGNOSIS — Z1211 Encounter for screening for malignant neoplasm of colon: Secondary | ICD-10-CM

## 2018-12-08 MED ORDER — NA SULFATE-K SULFATE-MG SULF 17.5-3.13-1.6 GM/177ML PO SOLN: ORAL | 0 refills | Status: DC

## 2018-12-08 NOTE — Progress Notes (Signed)
Patient is here in-person for PV with her interpreter-Mariel= (temp. 96.9). Patient denies any allergies to eggs or soy. Patient denies any problems with anesthesia/sedation. Patient denies any oxygen use at home. Patient denies taking any diet/weight loss medications or blood thinners. EMMI education assisgned to patient on colonoscopy, this was explained and instructions given to patient. Suprep $15 off coupon given to pt.   Pt is aware that care partner will wait in the car during procedure; if they feel like they will be too hot to wait in the car; they may wait in the lobby.  We want them to wear a mask (we do not have any that we can provide them), practice social distancing, and we will check their temperatures when they get here.  I did remind patient that their care partner needs to stay in the parking lot the entire time. Pt will wear mask into building.

## 2018-12-21 ENCOUNTER — Telehealth: Payer: Self-pay

## 2018-12-21 NOTE — Telephone Encounter (Signed)
Covid-19 screening questions   Do you now or have you had a fever in the last 14 days? NO   Do you have any respiratory symptoms of shortness of breath or cough now or in the last 14 days? NO  Do you have any family members or close contacts with diagnosed or suspected Covid-19 in the past 14 days? NO  Have you been tested for Covid-19 and found to be positive? NO        

## 2018-12-22 ENCOUNTER — Encounter: Payer: Self-pay | Admitting: Gastroenterology

## 2018-12-22 ENCOUNTER — Ambulatory Visit (AMBULATORY_SURGERY_CENTER): Payer: Self-pay | Admitting: Gastroenterology

## 2018-12-22 ENCOUNTER — Other Ambulatory Visit: Payer: Self-pay

## 2018-12-22 VITALS — BP 114/74 | HR 50 | Temp 97.8°F | Resp 26 | Ht 61.0 in | Wt 141.0 lb

## 2018-12-22 DIAGNOSIS — K635 Polyp of colon: Secondary | ICD-10-CM

## 2018-12-22 DIAGNOSIS — Z1211 Encounter for screening for malignant neoplasm of colon: Secondary | ICD-10-CM

## 2018-12-22 DIAGNOSIS — D123 Benign neoplasm of transverse colon: Secondary | ICD-10-CM

## 2018-12-22 MED ORDER — SODIUM CHLORIDE 0.9 % IV SOLN: 500.0000 mL | Freq: Once | INTRAVENOUS | Status: DC

## 2018-12-22 NOTE — Patient Instructions (Signed)
USTED TUVO UN PROCEDIMIENTO ENDOSCPICO HOY EN EL [facility]:   Lea el informe del procedimiento que se le entreg para cualquier pregunta especfica sobre lo que se encontr durante su examen.  Si el informe del examen no responde a sus preguntas, por favor llame a su gastroenterlogo para aclararlo.  Si usted solicit que no se le den detalles de lo que se encontr en su procedimiento al acompaante que le va a cuidar, entonces el informe del procedimiento se ha incluido en un sobre sellado para que usted lo revise despus cuando le sea ms conveniente.   LO QUE PUEDE ESPERAR: Algunas sensaciones de hinchazn en el abdomen.  Puede tener ms gases de lo normal.  El caminar puede ayudarle a eliminar el aire que se le puso en el tracto gastrointestinal durante el procedimiento y reducir la hinchazn.  Si le hicieron una endoscopia inferior (como una colonoscopia o una sigmoidoscopia flexible), podra notar manchas de sangre en las heces fecales o en el papel higinico.  Si se someti a una preparacin intestinal para su procedimiento, es posible que no tenga una evacuacin intestinal normal durante algunos das.   Tenga en cuenta:  Es posible que note un poco de irritacin y congestin en la nariz o algn drenaje.  Esto es debido al oxgeno utilizado durante su procedimiento.  No hay que preocuparse y esto debe desaparecer ms o menos en un da.   SNTOMAS PARA REPORTAR INMEDIATAMENTE:  Despus de una endoscopia inferior (colonoscopia o sigmoidoscopia flexible):  Cantidades excesivas de sangre en las heces fecales  Sensibilidad significativa o empeoramiento de los dolores abdominales   Hinchazn aguda del abdomen que antes no tena   Fiebre de 100F o ms    Para asuntos urgentes o de emergencia, puede comunicarse con un gastroenterlogo a cualquier hora llamando al [phone].  DIETA:  Recomendamos una comida pequea al principio, pero luego puede continuar con su dieta normal.   Tome muchos lquidos, pero debe evitar las bebidas alcohlicas durante 24 horas.    ACTIVIDAD:  Debe planear tomarse las cosas con calma por el resto del da y no debe CONDUCIR ni usar maquinaria pesada hasta maana (debido a los medicamentos de sedacin utilizados durante el examen).     SEGUIMIENTO: Nuestro personal llamar al nmero que aparece en su historial al siguiente da hbil de su procedimiento para ver cmo se siente y para responder cualquier pregunta o inquietud que pueda tener con respecto a la informacin que se le dio despus del procedimiento. Si no podemos contactarle, le dejaremos un mensaje.  Sin embargo, si se siente bien y no tiene ningn problema, no es necesario que nos devuelva la llamada.  Asumiremos que ha regresado a sus actividades diarias normales sin incidentes. Si se le tomaron algunas biopsias, le contactaremos por telfono o por carta en las prximas 3 semanas.  Si no ha sabido nada sobre las biopsias en el transcurso de 3 semanas, por favor llmenos al [phone].   FIRMAS/CONFIDENCIALIDAD: Usted y/o el acompaante que le cuide han firmado documentos que se ingresarn en su historial mdico electrnico.  Estas firmas atestiguan el hecho de que la informacin anterior  

## 2018-12-22 NOTE — Progress Notes (Signed)
Called to room to assist during endoscopic procedure.  Patient ID and intended procedure confirmed with present staff. Received instructions for my participation in the procedure from the performing physician.  

## 2018-12-22 NOTE — Op Note (Signed)
[facility] Patient Name: Tara Bruce Procedure Date: 12/22/2018 8:49 AM MRN: [id number] Endoscopist: Thornton Park MD, MD Age: 53 Referring MD:  Date of Birth: [birth year] Gender: Female Account #: [id number] Procedure:                Colonoscopy Indications:              Screening for colorectal malignant neoplasm                           Reports a normal colonoscopy 10 years ago                           No known family history of colon cancer or polyps                           No baseline GI symptoms Medicines:                See the Anesthesia note for documentation of the                            administered medications Procedure:                Pre-Anesthesia Assessment:                           - Prior to the procedure, a History and Physical                            was performed, and patient medications and                            allergies were reviewed. The patient's tolerance of                            previous anesthesia was also reviewed. The risks                            and benefits of the procedure and the sedation                            options and risks were discussed with the patient.                            All questions were answered, and informed consent                            was obtained. Prior Anticoagulants: The patient has                            taken no previous anticoagulant or antiplatelet                            agents. ASA Grade Assessment: I - A normal, healthy  patient. After reviewing the risks and benefits,                            the patient was deemed in satisfactory condition to                            undergo the procedure.                           After obtaining informed consent, the colonoscope                            was passed under direct vision. Throughout the                            procedure, the patient's blood pressure, pulse, and                oxygen saturations were monitored continuously. The                            Colonoscope was introduced through the anus and                            advanced to the the terminal ileum, with                            identification of the appendiceal orifice and IC                            valve. A second forward view of the right colon was                            performed. The colonoscopy was performed without                            difficulty. The patient tolerated the procedure                            well. The quality of the bowel preparation was                            good. The terminal ileum, ileocecal valve,                            appendiceal orifice, and rectum were photographed. Scope In: 8:59:11 AM Scope Out: 9:15:22 AM Scope Withdrawal Time: 0 hours 11 minutes 56 seconds  Total Procedure Duration: 0 hours 16 minutes 11 seconds  Findings:                 The perianal and digital rectal examinations were                            normal.  Non-bleeding internal hemorrhoids were found. The                            hemorrhoids were small.                           A less than 1 mm polyp was found in the hepatic                            flexure. The polyp was sessile. The polyp was                            removed with a cold biopsy forceps. Resection and                            retrieval were complete. Estimated blood loss was                            minimal.                           The exam was otherwise without abnormality on                            direct and retroflexion views. Complications:            No immediate complications. Estimated blood loss:                            Minimal. Estimated Blood Loss:     Estimated blood loss was minimal. Impression:               - Non-bleeding internal hemorrhoids.                           - One less than 1 mm polyp at the hepatic flexure,                             removed with a cold biopsy forceps. Resected and                            retrieved.                           - The examination was otherwise normal on direct                            and retroflexion views. Recommendation:           - Patient has a contact number available for                            emergencies. The signs and symptoms of potential                            delayed complications were discussed with the  patient. Return to normal activities tomorrow.                            Written discharge instructions were provided to the                            patient.                           - Resume previous diet today.                           - Continue present medications.                           - Await pathology results.                           - Repeat colonoscopy in 7 years for surveillance if                            the polyp is an adenoma. Thornton Park MD, MD 12/22/2018 9:23:03 AM This report has been signed electronically.

## 2018-12-22 NOTE — Progress Notes (Signed)
Report given to PACU, vss 

## 2018-12-22 NOTE — Progress Notes (Signed)
Temperature- [place]  Interpreter used today at the [employer] for this pt.  Interpreter's name is- Rosemarie Ax  Pt's states no medical or surgical changes since previsit or office visit.

## 2018-12-26 ENCOUNTER — Telehealth: Payer: Self-pay

## 2018-12-26 NOTE — Telephone Encounter (Signed)
  Follow up Call-  Call back number 12/22/2018  Post procedure Call Back phone  # [phone]  Permission to leave phone message Yes  Some recent data might be hidden     Patient questions:  Do you have a fever, pain , or abdominal swelling? No. Pain Score  0 *  Have you tolerated food without any problems? Yes.    Have you been able to return to your normal activities? Yes.    Do you have any questions about your discharge instructions: Diet   No. Medications  No. Follow up visit  No.  Do you have questions or concerns about your Care? No.  Actions: * If pain score is 4 or above: No action needed, pain <4.  1. Have you developed a fever since your procedure? no  2.   Have you had an respiratory symptoms (SOB or cough) since your procedure? no  3.   Have you tested positive for COVID 19 since your procedure no  4.   Have you had any family members/close contacts diagnosed with the COVID 19 since your procedure?  no   If yes to any of these questions please route to Joylene John, RN and Alphonsa Gin, Therapist, sports.

## 2018-12-27 ENCOUNTER — Encounter: Payer: Self-pay | Admitting: Gastroenterology

## 2019-11-27 ENCOUNTER — Other Ambulatory Visit: Payer: Self-pay | Admitting: Medical

## 2019-11-27 DIAGNOSIS — Z1231 Encounter for screening mammogram for malignant neoplasm of breast: Secondary | ICD-10-CM

## 2019-12-04 ENCOUNTER — Other Ambulatory Visit: Payer: Self-pay

## 2019-12-04 ENCOUNTER — Ambulatory Visit
Admission: RE | Admit: 2019-12-04 | Discharge: 2019-12-04 | Disposition: A | Discharge status: Home / Self Care | Payer: 59 | Source: Ambulatory Visit | Attending: Medical | Admitting: Medical

## 2019-12-04 DIAGNOSIS — Z1231 Encounter for screening mammogram for malignant neoplasm of breast: Secondary | ICD-10-CM

## 2020-04-15 ENCOUNTER — Other Ambulatory Visit: Payer: Self-pay

## 2020-04-15 ENCOUNTER — Ambulatory Visit
Admission: EM | Admit: 2020-04-15 | Discharge: 2020-04-15 | Disposition: A | Discharge status: Home / Self Care | Payer: 59 | Attending: Emergency Medicine | Admitting: Emergency Medicine

## 2020-04-15 ENCOUNTER — Ambulatory Visit (INDEPENDENT_AMBULATORY_CARE_PROVIDER_SITE_OTHER): Payer: 59

## 2020-04-15 DIAGNOSIS — S62637A Displaced fracture of distal phalanx of left little finger, initial encounter for closed fracture: Secondary | ICD-10-CM

## 2020-04-15 DIAGNOSIS — S67197A Crushing injury of left little finger, initial encounter: Secondary | ICD-10-CM

## 2020-04-15 MED ORDER — CEPHALEXIN 500 MG PO CAPS: 500.0000 mg | ORAL_CAPSULE | Freq: Four times a day (QID) | ORAL | 0 refills | Status: AC

## 2020-04-15 MED ORDER — IBUPROFEN 800 MG PO TABS: 800.0000 mg | ORAL_TABLET | Freq: Three times a day (TID) | ORAL | 0 refills | Status: AC

## 2020-04-15 NOTE — Discharge Instructions (Addendum)
Use anti-inflammatories for pain/swelling. You may take up to 800 mg Ibuprofen every 8 hours with food. You may supplement Ibuprofen with Tylenol [id number] mg every 8 hours.  Keflex 4 times daily for infection Please go to sports medicine for follow-up, contacts below Wear splint for protection and immobilization 24/7

## 2020-04-15 NOTE — ED Triage Notes (Signed)
Patient states she injured her left pinky finger about a week ago. Pt states she cut it. Pt has her nail cut through and it is still attached to the nail bed. Pt is aox4 and ambulatory.

## 2020-04-15 NOTE — ED Provider Notes (Signed)
[facility]    CSN: [id number] Arrival date & time: 04/15/20  1519      History   Chief Complaint Chief Complaint  Patient presents with  . Finger Injury    Occurred 1 week ago    HPI Tara Bruce is a 55 y.o. female presenting today for evaluation nail injury.  Reports she injured left pinky finger approximately 2 week ago.  Caught left pinky finger in a door causing her acrylic and natural nail to break in half.  She continues to have pain 2 weeks later and a lot of sensitivity with touching her finger.  Reports finger initially bruised and discolored with black-blue discoloration, this has improved, but continues to be red.  Denies pus drainage from nail bed, but does report of bloody drainage.  HPI  Past Medical History:  Diagnosis Date  . Hyperlipidemia   . Kidney stone     Patient Active Problem List   Diagnosis Date Noted  . Kidney stones 06/19/2015  . Hydronephrosis with urinary obstruction due to ureteral calculus 06/19/2015  . Gross hematuria 06/19/2015  . Restless leg syndrome 10/26/2012    Past Surgical History:  Procedure Laterality Date  . CHOLECYSTECTOMY    . COLONOSCOPY  2010?   pt does not remember where.   [facility] LAPAROSCOPIC OOPHERECTOMY  2013  . TOTAL ABDOMINAL HYSTERECTOMY W/ BILATERAL SALPINGOOPHORECTOMY      OB History   No obstetric history on file.      Home Medications    Prior to Admission medications   Medication Sig Start Date End Date Taking? Authorizing Provider  Ascorbic Acid (VITAMIN C) 500 MG CHEW Chew 2 each by mouth daily.   Yes [provider]  cephALEXin (KEFLEX) 500 MG capsule Take 1 capsule (500 mg total) by mouth 4 (four) times daily for 7 days. 04/15/20 04/22/20 Yes Overton Boggus C, PA-C  ibuprofen (ADVIL) 800 MG tablet Take 1 tablet (800 mg total) by mouth 3 (three) times daily. 04/15/20  Yes Carsynn Bethune C, PA-C  Multiple Vitamins-Minerals (CENTRUM ADULTS PO) Take by mouth.   Yes [provider]  COLLAGEN PO Take by mouth.    [provider]    Family History Family History  Problem Relation Age of Onset  . Cancer Mother 86       Colon Cancer  . Diabetes Mother   . Colon cancer Mother 75  . Cancer Father        Prostate Cancer  . Kidney disease Father   . Diabetes Sister   . Diabetes Sister   . Diabetes Sister   . Breast cancer Neg Hx   . Esophageal cancer Neg Hx   . Rectal cancer Neg Hx   . Stomach cancer Neg Hx   . Colon polyps Neg Hx     Social History Social History   Tobacco Use  . Smoking status: Never Smoker  . Smokeless tobacco: Never Used  Vaping Use  . Vaping Use: Never used  Substance Use Topics  . Alcohol use: No    Alcohol/week: 0.0 standard drinks  . Drug use: No     Allergies   Patient has no known allergies.   Review of Systems Review of Systems  Constitutional: Negative for fatigue and fever.  HENT: Negative for mouth sores.   Eyes: Negative for visual disturbance.  Respiratory: Negative for shortness of breath.   Cardiovascular: Negative for chest pain.  Gastrointestinal: Negative for abdominal pain, nausea and vomiting.  Genitourinary:  Negative for genital sores.  Musculoskeletal: Negative for arthralgias and joint swelling.  Skin: Positive for wound. Negative for color change and rash.  Neurological: Negative for dizziness, weakness, light-headedness and headaches.     Physical Exam Triage Vital Signs ED Triage Vitals  Enc Vitals Group     BP 04/15/20 1658 (!) 155/90     Pulse Rate 04/15/20 1658 66     Resp 04/15/20 1658 17     Temp 04/15/20 1658 98.3 F (36.8 C)     Temp Source 04/15/20 1658 Oral     SpO2 04/15/20 1658 96 %     Weight --      Height --      Head Circumference --      Peak Flow --      Pain Score 04/15/20 1659 8     Pain Loc --      Pain Edu? --      Excl. in [place]? --    No data found.  Updated Vital Signs BP (!) 155/90 (BP Location: Left Arm)   Pulse 66   Temp 98.3  F (36.8 C) (Oral)   Resp 17   SpO2 96%   Visual Acuity Right Eye Distance:   Left Eye Distance:   Bilateral Distance:    Right Eye Near:   Left Eye Near:    Bilateral Near:     Physical Exam Vitals and nursing note reviewed.  Constitutional:      Appearance: She is well-developed and well-nourished.     Comments: No acute distress  HENT:     Head: Normocephalic and atraumatic.     Nose: Nose normal.  Eyes:     Conjunctiva/sclera: Conjunctivae normal.  Cardiovascular:     Rate and Rhythm: Normal rate.  Pulmonary:     Effort: Pulmonary effort is normal. No respiratory distress.  Abdominal:     General: There is no distension.  Musculoskeletal:        General: Normal range of motion.     Cervical back: Neck supple.  Skin:    General: Skin is warm and dry.     Comments: Left little finger with distal portion of nailbed with laceration and separation from proximal portion, laceration scabbed, not actively draining, distal finger pad erythematous with overlying skin peeling, sensitive to palpation, no obvious pus  Neurological:     Mental Status: She is alert and oriented to person, place, and time.  Psychiatric:        Mood and Affect: Mood and affect normal.      UC Treatments / Results  Labs (all labs ordered are listed, but only abnormal results are displayed) Labs Reviewed - No data to display  EKG   Radiology DG Finger Little Left  Result Date: 04/15/2020 CLINICAL DATA:  Crush injury EXAM: LEFT LITTLE FINGER 2+V COMPARISON:  None. FINDINGS: Acute mildly displaced fracture involving the tuft of the fifth distal phalanx with overlying soft tissue injury. No radiopaque foreign body IMPRESSION: Acute mildly displaced fracture involving the tuft of the fifth distal phalanx. Electronically Signed   By: Donavan Foil M.D.   On: 04/15/2020 18:30    Procedures Procedures (including critical care time)  Medications Ordered in UC Medications - No data to  display  Initial Impression / Assessment and Plan / UC Course  I have reviewed the triage vital signs and the nursing notes.  Pertinent labs & imaging results that were available during my care of the patient were reviewed  by me and considered in my medical decision making (see chart for details).     Fifth distal phalanx fracture mildly displaced, likely contributing to persistent pain associated with this, will treat as open fracture and provide static splint COVID-19 with Keflex to help with any underlying infection contributing to increased pain and anti-inflammatories.  We will have her follow-up with sports medicine/orthopedics for further monitoring and management of this injury.  Expect nailbed injury to gradually heal with time and distal portion did attach as proximal portion grows out.  Discussed strict return precautions. Patient verbalized understanding and is agreeable with plan.  Final Clinical Impressions(s) / UC Diagnoses   Final diagnoses:  Closed displaced fracture of distal phalanx of left little finger, initial encounter     Discharge Instructions     Use anti-inflammatories for pain/swelling. You may take up to 800 mg Ibuprofen every 8 hours with food. You may supplement Ibuprofen with Tylenol [id number] mg every 8 hours.  Keflex 4 times daily for infection Please go to sports medicine for follow-up, contacts below Wear splint for protection and immobilization 24/7      ED Prescriptions    Medication Sig Dispense Auth. Provider   ibuprofen (ADVIL) 800 MG tablet Take 1 tablet (800 mg total) by mouth 3 (three) times daily. 21 tablet Jacquel Redditt C, PA-C   cephALEXin (KEFLEX) 500 MG capsule Take 1 capsule (500 mg total) by mouth 4 (four) times daily for 7 days. 28 capsule Payson Evrard, [place] C, PA-C     PDMP not reviewed this encounter.   Janith Lima, [place] 04/15/20 1846

## 2020-07-23 ENCOUNTER — Other Ambulatory Visit: Payer: Self-pay

## 2020-07-23 ENCOUNTER — Ambulatory Visit
Admission: RE | Admit: 2020-07-23 | Discharge: 2020-07-23 | Disposition: A | Discharge status: Home / Self Care | Payer: 59 | Source: Ambulatory Visit | Attending: Family Medicine | Admitting: Family Medicine

## 2020-07-23 VITALS — BP 165/93 | HR 64 | Temp 98.2°F | Resp 20

## 2020-07-23 DIAGNOSIS — J3089 Other allergic rhinitis: Secondary | ICD-10-CM | POA: Diagnosis not present

## 2020-07-23 DIAGNOSIS — J3489 Other specified disorders of nose and nasal sinuses: Secondary | ICD-10-CM

## 2020-07-23 MED ORDER — PREDNISONE 20 MG PO TABS: 40.0000 mg | ORAL_TABLET | Freq: Every day | ORAL | 0 refills | Status: AC

## 2020-07-23 MED ORDER — FLUTICASONE PROPIONATE 50 MCG/ACT NA SUSP: 1.0000 | Freq: Every day | NASAL | 2 refills | Status: AC

## 2020-07-23 NOTE — ED Provider Notes (Signed)
[facility]    CSN: [id number] Arrival date & time: 07/23/20  1250      History   Chief Complaint Chief Complaint  Patient presents with  . Nasal Congestion    HPI Tara Bruce is a 55 y.o. female.   Here today with 2-day history of left-sided facial pain and pressure, congestion, watery eyes, postnasal drainage, scratchy throat.  She denies fever, chills, chest pain, shortness of breath, abdominal pain, nausea vomiting diarrhea.  Taking antihistamines daily for her significant seasonal allergies, feels that this is breakthrough symptoms from that.  Has not taken a home COVID test at this time.  No known sick contacts.     Past Medical History:  Diagnosis Date  . Hyperlipidemia   . Kidney stone     Patient Active Problem List   Diagnosis Date Noted  . Kidney stones 06/19/2015  . Hydronephrosis with urinary obstruction due to ureteral calculus 06/19/2015  . Gross hematuria 06/19/2015  . Restless leg syndrome 10/26/2012    Past Surgical History:  Procedure Laterality Date  . CHOLECYSTECTOMY    . COLONOSCOPY  2010?   pt does not remember where.   [facility] LAPAROSCOPIC OOPHERECTOMY  2013  . TOTAL ABDOMINAL HYSTERECTOMY W/ BILATERAL SALPINGOOPHORECTOMY      OB History   No obstetric history on file.      Home Medications    Prior to Admission medications   Medication Sig Start Date End Date Taking? Authorizing Provider  fluticasone (FLONASE) 50 MCG/ACT nasal spray Place 1 spray into both nostrils daily. 07/23/20  Yes Volney American, PA-C  predniSONE (DELTASONE) 20 MG tablet Take 2 tablets (40 mg total) by mouth daily with breakfast. 07/23/20  Yes Volney American, PA-C  Ascorbic Acid (VITAMIN C) 500 MG CHEW Chew 2 each by mouth daily.    [provider]  COLLAGEN PO Take by mouth.    [provider]  ibuprofen (ADVIL) 800 MG tablet Take 1 tablet (800 mg total) by mouth 3 (three) times daily. 04/15/20   Wieters, Hallie C,  PA-C  Multiple Vitamins-Minerals (CENTRUM ADULTS PO) Take by mouth.    [provider]    Family History Family History  Problem Relation Age of Onset  . Cancer Mother 58       Colon Cancer  . Diabetes Mother   . Colon cancer Mother 65  . Cancer Father        Prostate Cancer  . Kidney disease Father   . Diabetes Sister   . Diabetes Sister   . Diabetes Sister   . Breast cancer Neg Hx   . Esophageal cancer Neg Hx   . Rectal cancer Neg Hx   . Stomach cancer Neg Hx   . Colon polyps Neg Hx     Social History Social History   Tobacco Use  . Smoking status: Never Smoker  . Smokeless tobacco: Never Used  Vaping Use  . Vaping Use: Never used  Substance Use Topics  . Alcohol use: No    Alcohol/week: 0.0 standard drinks  . Drug use: No     Allergies   Patient has no known allergies.   Review of Systems Review of Systems Per HPI Physical Exam Triage Vital Signs ED Triage Vitals  Enc Vitals Group     BP 07/23/20 1311 (!) 165/93     Pulse Rate 07/23/20 1311 64     Resp 07/23/20 1311 20     Temp 07/23/20 1311 98.2 F (36.8  C)     Temp Source 07/23/20 1311 Oral     SpO2 07/23/20 1311 96 %     Weight --      Height --      Head Circumference --      Peak Flow --      Pain Score 07/23/20 1323 5     Pain Loc --      Pain Edu? --      Excl. in [place]? --    No data found.  Updated Vital Signs BP (!) 165/93 (BP Location: Left Arm)   Pulse 64   Temp 98.2 F (36.8 C) (Oral)   Resp 20   SpO2 96%   Visual Acuity Right Eye Distance:   Left Eye Distance:   Bilateral Distance:    Right Eye Near:   Left Eye Near:    Bilateral Near:     Physical Exam Vitals and nursing note reviewed.  Constitutional:      Appearance: Normal appearance. She is not ill-appearing.  HENT:     Head: Atraumatic.     Right Ear: Tympanic membrane normal.     Left Ear: Tympanic membrane normal.     Nose: Rhinorrhea present.     Mouth/Throat:     Mouth: Mucous membranes are  moist.     Pharynx: Oropharynx is clear. Posterior oropharyngeal erythema present.  Eyes:     Extraocular Movements: Extraocular movements intact.     Conjunctiva/sclera: Conjunctivae normal.  Cardiovascular:     Rate and Rhythm: Normal rate and regular rhythm.     Heart sounds: Normal heart sounds.  Pulmonary:     Effort: Pulmonary effort is normal.     Breath sounds: Normal breath sounds. No wheezing or rales.  Abdominal:     General: Bowel sounds are normal. There is no distension.     Palpations: Abdomen is soft.     Tenderness: There is no abdominal tenderness. There is no guarding.  Musculoskeletal:        General: Normal range of motion.     Cervical back: Normal range of motion and neck supple.  Skin:    General: Skin is warm and dry.     Findings: No erythema or rash.  Neurological:     Mental Status: She is alert and oriented to person, place, and time.     Motor: No weakness.     Gait: Gait normal.  Psychiatric:        Mood and Affect: Mood normal.        Thought Content: Thought content normal.        Judgment: Judgment normal.    UC Treatments / Results  Labs (all labs ordered are listed, but only abnormal results are displayed) Labs Reviewed - No data to display  EKG  Radiology No results found.  Procedures Procedures (including critical care time)  Medications Ordered in UC Medications - No data to display  Initial Impression / Assessment and Plan / UC Course  I have reviewed the triage vital signs and the nursing notes.  Pertinent labs & imaging results that were available during my care of the patient were reviewed by me and considered in my medical decision making (see chart for details).     Mildly hypertensive in triage today but otherwise vital signs reassuring.  Symptoms do appear consistent with uncontrolled seasonal allergies.  We will do a prednisone burst, add Flonase to daily antihistamines.  Sinus rinses, humidifier as needed.  Return  for acutely worsening symptoms.  Declines COVID testing today.  Final Clinical Impressions(s) / UC Diagnoses   Final diagnoses:  Seasonal allergic rhinitis due to other allergic trigger  Sinus pressure   Discharge Instructions   None    ED Prescriptions    Medication Sig Dispense Auth. Provider   predniSONE (DELTASONE) 20 MG tablet Take 2 tablets (40 mg total) by mouth daily with breakfast. 10 tablet Volney American, PA-C   fluticasone [facility]) 50 MCG/ACT nasal spray Place 1 spray into both nostrils daily. 16 g Volney American, [place]     PDMP not reviewed this encounter.   Volney American, [place] 07/23/20 1454

## 2020-07-23 NOTE — ED Triage Notes (Signed)
Pt here for nasal congestion and watery eyes; pt sts left side facial pain around left eye

## 2021-01-01 ENCOUNTER — Other Ambulatory Visit: Payer: Self-pay | Admitting: Medical

## 2021-01-01 DIAGNOSIS — Z1231 Encounter for screening mammogram for malignant neoplasm of breast: Secondary | ICD-10-CM

## 2021-01-02 ENCOUNTER — Other Ambulatory Visit: Payer: Self-pay

## 2021-01-02 ENCOUNTER — Ambulatory Visit
Admission: RE | Admit: 2021-01-02 | Discharge: 2021-01-02 | Disposition: A | Discharge status: Home / Self Care | Payer: 59 | Source: Ambulatory Visit | Attending: Medical | Admitting: Medical

## 2021-01-02 DIAGNOSIS — Z1231 Encounter for screening mammogram for malignant neoplasm of breast: Secondary | ICD-10-CM

## 2021-01-08 ENCOUNTER — Other Ambulatory Visit: Payer: Self-pay | Admitting: Medical

## 2021-01-08 DIAGNOSIS — R928 Other abnormal and inconclusive findings on diagnostic imaging of breast: Secondary | ICD-10-CM

## 2021-01-21 ENCOUNTER — Other Ambulatory Visit: Payer: Self-pay | Admitting: Emergency Medicine

## 2021-01-26 ENCOUNTER — Ambulatory Visit
Admission: RE | Admit: 2021-01-26 | Discharge: 2021-01-26 | Disposition: A | Discharge status: Home / Self Care | Payer: 59 | Source: Ambulatory Visit | Attending: Medical | Admitting: Medical

## 2021-01-26 ENCOUNTER — Ambulatory Visit: Payer: 59

## 2021-01-26 DIAGNOSIS — R928 Other abnormal and inconclusive findings on diagnostic imaging of breast: Secondary | ICD-10-CM

## 2021-11-19 ENCOUNTER — Telehealth: Payer: Self-pay | Admitting: "Endocrinology

## 2021-11-19 NOTE — Telephone Encounter (Signed)
Received referral on pt. Pt's daughter called for weeks to see if we had it, when I did get it I called and left VM on 7/25, 8/7 and mailed letter 8/10. I called again on 11/19/21. Referral closed

## 2022-01-14 ENCOUNTER — Other Ambulatory Visit: Payer: Self-pay | Admitting: Medical

## 2022-01-14 DIAGNOSIS — Z1231 Encounter for screening mammogram for malignant neoplasm of breast: Secondary | ICD-10-CM

## 2022-03-05 ENCOUNTER — Ambulatory Visit: Payer: Self-pay

## 2022-03-08 ENCOUNTER — Ambulatory Visit
Admission: RE | Admit: 2022-03-08 | Discharge: 2022-03-08 | Disposition: A | Discharge status: Home / Self Care | Payer: Commercial Managed Care - HMO | Source: Ambulatory Visit | Attending: Medical | Admitting: Medical

## 2022-03-08 DIAGNOSIS — Z1231 Encounter for screening mammogram for malignant neoplasm of breast: Secondary | ICD-10-CM

## 2023-04-15 ENCOUNTER — Other Ambulatory Visit: Payer: Self-pay | Admitting: Medical

## 2023-04-15 DIAGNOSIS — Z1231 Encounter for screening mammogram for malignant neoplasm of breast: Secondary | ICD-10-CM

## 2023-04-19 ENCOUNTER — Ambulatory Visit
Admission: RE | Admit: 2023-04-19 | Discharge: 2023-04-19 | Disposition: A | Discharge status: Home / Self Care | Payer: Commercial Managed Care - HMO | Source: Ambulatory Visit | Attending: Medical | Admitting: Medical

## 2023-04-19 DIAGNOSIS — Z1231 Encounter for screening mammogram for malignant neoplasm of breast: Secondary | ICD-10-CM
